# Patient Record
Sex: Female | Born: 1990 | Race: White | Hispanic: No | Smoking: Current every day smoker
Health system: Southern US, Community
[De-identification: ages and names within clinical notes are randomized; demographics above are authoritative.]

## PROBLEM LIST (undated history)

## (undated) DIAGNOSIS — R569 Unspecified convulsions: Secondary | ICD-10-CM

## (undated) DIAGNOSIS — F32A Depression, unspecified: Secondary | ICD-10-CM

## (undated) HISTORY — PX: BRAIN SURGERY: SHX531

---

## 2010-04-01 ENCOUNTER — Ambulatory Visit (HOSPITAL_COMMUNITY): Admission: RE | Admit: 2010-04-01 | Discharge: 2010-04-01 | Payer: Self-pay | Admitting: Neurology

## 2011-08-31 DIAGNOSIS — Z9889 Other specified postprocedural states: Secondary | ICD-10-CM | POA: Diagnosis not present

## 2011-08-31 DIAGNOSIS — O09899 Supervision of other high risk pregnancies, unspecified trimester: Secondary | ICD-10-CM | POA: Diagnosis not present

## 2011-08-31 DIAGNOSIS — G40209 Localization-related (focal) (partial) symptomatic epilepsy and epileptic syndromes with complex partial seizures, not intractable, without status epilepticus: Secondary | ICD-10-CM | POA: Diagnosis not present

## 2011-08-31 DIAGNOSIS — F909 Attention-deficit hyperactivity disorder, unspecified type: Secondary | ICD-10-CM | POA: Diagnosis not present

## 2011-10-06 DIAGNOSIS — Z3480 Encounter for supervision of other normal pregnancy, unspecified trimester: Secondary | ICD-10-CM | POA: Diagnosis not present

## 2011-10-06 DIAGNOSIS — Z36 Encounter for antenatal screening of mother: Secondary | ICD-10-CM | POA: Diagnosis not present

## 2011-10-19 DIAGNOSIS — Z79899 Other long term (current) drug therapy: Secondary | ICD-10-CM | POA: Diagnosis not present

## 2011-10-19 DIAGNOSIS — F079 Unspecified personality and behavioral disorder due to known physiological condition: Secondary | ICD-10-CM | POA: Diagnosis not present

## 2011-10-19 DIAGNOSIS — G40909 Epilepsy, unspecified, not intractable, without status epilepticus: Secondary | ICD-10-CM | POA: Diagnosis not present

## 2011-10-31 DIAGNOSIS — G40209 Localization-related (focal) (partial) symptomatic epilepsy and epileptic syndromes with complex partial seizures, not intractable, without status epilepticus: Secondary | ICD-10-CM | POA: Diagnosis not present

## 2011-12-02 DIAGNOSIS — Z1389 Encounter for screening for other disorder: Secondary | ICD-10-CM | POA: Diagnosis not present

## 2011-12-02 DIAGNOSIS — Z3480 Encounter for supervision of other normal pregnancy, unspecified trimester: Secondary | ICD-10-CM | POA: Diagnosis not present

## 2011-12-02 DIAGNOSIS — O321XX0 Maternal care for breech presentation, not applicable or unspecified: Secondary | ICD-10-CM | POA: Diagnosis not present

## 2012-01-09 DIAGNOSIS — Z36 Encounter for antenatal screening of mother: Secondary | ICD-10-CM | POA: Diagnosis not present

## 2012-01-09 DIAGNOSIS — O360990 Maternal care for other rhesus isoimmunization, unspecified trimester, not applicable or unspecified: Secondary | ICD-10-CM | POA: Diagnosis not present

## 2012-01-09 DIAGNOSIS — Z3480 Encounter for supervision of other normal pregnancy, unspecified trimester: Secondary | ICD-10-CM | POA: Diagnosis not present

## 2012-01-12 DIAGNOSIS — G40909 Epilepsy, unspecified, not intractable, without status epilepticus: Secondary | ICD-10-CM | POA: Diagnosis not present

## 2012-01-12 DIAGNOSIS — Z79899 Other long term (current) drug therapy: Secondary | ICD-10-CM | POA: Diagnosis not present

## 2012-01-20 DIAGNOSIS — G40909 Epilepsy, unspecified, not intractable, without status epilepticus: Secondary | ICD-10-CM | POA: Diagnosis not present

## 2012-01-20 DIAGNOSIS — G40209 Localization-related (focal) (partial) symptomatic epilepsy and epileptic syndromes with complex partial seizures, not intractable, without status epilepticus: Secondary | ICD-10-CM | POA: Diagnosis not present

## 2012-01-20 DIAGNOSIS — O09899 Supervision of other high risk pregnancies, unspecified trimester: Secondary | ICD-10-CM | POA: Diagnosis not present

## 2012-01-20 DIAGNOSIS — Z331 Pregnant state, incidental: Secondary | ICD-10-CM | POA: Diagnosis not present

## 2012-02-29 DIAGNOSIS — G40919 Epilepsy, unspecified, intractable, without status epilepticus: Secondary | ICD-10-CM | POA: Diagnosis not present

## 2012-02-29 DIAGNOSIS — O09899 Supervision of other high risk pregnancies, unspecified trimester: Secondary | ICD-10-CM | POA: Diagnosis not present

## 2012-03-09 DIAGNOSIS — R569 Unspecified convulsions: Secondary | ICD-10-CM | POA: Diagnosis not present

## 2012-03-09 DIAGNOSIS — Z3685 Encounter for antenatal screening for Streptococcus B: Secondary | ICD-10-CM | POA: Diagnosis not present

## 2012-03-09 DIAGNOSIS — G40909 Epilepsy, unspecified, not intractable, without status epilepticus: Secondary | ICD-10-CM | POA: Diagnosis not present

## 2012-03-09 DIAGNOSIS — O9989 Other specified diseases and conditions complicating pregnancy, childbirth and the puerperium: Secondary | ICD-10-CM | POA: Diagnosis not present

## 2012-03-09 DIAGNOSIS — Z3480 Encounter for supervision of other normal pregnancy, unspecified trimester: Secondary | ICD-10-CM | POA: Diagnosis not present

## 2012-04-10 DIAGNOSIS — D62 Acute posthemorrhagic anemia: Secondary | ICD-10-CM | POA: Diagnosis not present

## 2012-04-10 DIAGNOSIS — O99354 Diseases of the nervous system complicating childbirth: Secondary | ICD-10-CM | POA: Diagnosis not present

## 2012-04-10 DIAGNOSIS — Z79899 Other long term (current) drug therapy: Secondary | ICD-10-CM | POA: Diagnosis not present

## 2012-04-10 DIAGNOSIS — Z23 Encounter for immunization: Secondary | ICD-10-CM | POA: Diagnosis not present

## 2012-04-10 DIAGNOSIS — G40909 Epilepsy, unspecified, not intractable, without status epilepticus: Secondary | ICD-10-CM | POA: Diagnosis not present

## 2012-04-10 DIAGNOSIS — O43899 Other placental disorders, unspecified trimester: Secondary | ICD-10-CM | POA: Diagnosis present

## 2012-04-10 DIAGNOSIS — O99892 Other specified diseases and conditions complicating childbirth: Secondary | ICD-10-CM | POA: Diagnosis not present

## 2012-04-10 DIAGNOSIS — Z22330 Carrier of Group B streptococcus: Secondary | ICD-10-CM | POA: Diagnosis not present

## 2012-04-10 DIAGNOSIS — O9903 Anemia complicating the puerperium: Secondary | ICD-10-CM | POA: Diagnosis not present

## 2012-04-10 DIAGNOSIS — O34219 Maternal care for unspecified type scar from previous cesarean delivery: Secondary | ICD-10-CM | POA: Diagnosis not present

## 2012-04-10 DIAGNOSIS — O368990 Maternal care for other specified fetal problems, unspecified trimester, not applicable or unspecified: Secondary | ICD-10-CM | POA: Diagnosis not present

## 2012-04-18 DIAGNOSIS — Z79899 Other long term (current) drug therapy: Secondary | ICD-10-CM | POA: Diagnosis not present

## 2012-04-18 DIAGNOSIS — O900 Disruption of cesarean delivery wound: Secondary | ICD-10-CM | POA: Diagnosis not present

## 2012-04-18 DIAGNOSIS — T8131XA Disruption of external operation (surgical) wound, not elsewhere classified, initial encounter: Secondary | ICD-10-CM | POA: Diagnosis not present

## 2012-04-18 DIAGNOSIS — IMO0002 Reserved for concepts with insufficient information to code with codable children: Secondary | ICD-10-CM | POA: Diagnosis not present

## 2012-04-18 DIAGNOSIS — O909 Complication of the puerperium, unspecified: Secondary | ICD-10-CM | POA: Diagnosis not present

## 2012-04-18 DIAGNOSIS — G40909 Epilepsy, unspecified, not intractable, without status epilepticus: Secondary | ICD-10-CM | POA: Diagnosis not present

## 2012-04-20 DIAGNOSIS — T8130XA Disruption of wound, unspecified, initial encounter: Secondary | ICD-10-CM | POA: Diagnosis not present

## 2012-04-26 DIAGNOSIS — Z79899 Other long term (current) drug therapy: Secondary | ICD-10-CM | POA: Diagnosis not present

## 2012-04-26 DIAGNOSIS — G40909 Epilepsy, unspecified, not intractable, without status epilepticus: Secondary | ICD-10-CM | POA: Diagnosis not present

## 2012-07-04 DIAGNOSIS — Z3049 Encounter for surveillance of other contraceptives: Secondary | ICD-10-CM | POA: Diagnosis not present

## 2012-07-12 DIAGNOSIS — G40919 Epilepsy, unspecified, intractable, without status epilepticus: Secondary | ICD-10-CM | POA: Diagnosis not present

## 2012-07-12 DIAGNOSIS — Z9889 Other specified postprocedural states: Secondary | ICD-10-CM | POA: Diagnosis not present

## 2012-07-19 DIAGNOSIS — Z79899 Other long term (current) drug therapy: Secondary | ICD-10-CM | POA: Diagnosis not present

## 2012-07-19 DIAGNOSIS — G40909 Epilepsy, unspecified, not intractable, without status epilepticus: Secondary | ICD-10-CM | POA: Diagnosis not present

## 2012-08-01 DIAGNOSIS — Z3049 Encounter for surveillance of other contraceptives: Secondary | ICD-10-CM | POA: Diagnosis not present

## 2012-08-01 DIAGNOSIS — N926 Irregular menstruation, unspecified: Secondary | ICD-10-CM | POA: Diagnosis not present

## 2012-08-05 DIAGNOSIS — J40 Bronchitis, not specified as acute or chronic: Secondary | ICD-10-CM | POA: Diagnosis not present

## 2012-08-05 DIAGNOSIS — J209 Acute bronchitis, unspecified: Secondary | ICD-10-CM | POA: Diagnosis not present

## 2012-08-05 DIAGNOSIS — Z79899 Other long term (current) drug therapy: Secondary | ICD-10-CM | POA: Diagnosis not present

## 2012-08-23 DIAGNOSIS — R109 Unspecified abdominal pain: Secondary | ICD-10-CM | POA: Diagnosis not present

## 2012-08-23 DIAGNOSIS — Z79899 Other long term (current) drug therapy: Secondary | ICD-10-CM | POA: Diagnosis not present

## 2012-08-23 DIAGNOSIS — Z5320 Procedure and treatment not carried out because of patient's decision for unspecified reasons: Secondary | ICD-10-CM | POA: Diagnosis not present

## 2012-08-23 DIAGNOSIS — B9789 Other viral agents as the cause of diseases classified elsewhere: Secondary | ICD-10-CM | POA: Diagnosis not present

## 2012-08-23 DIAGNOSIS — R112 Nausea with vomiting, unspecified: Secondary | ICD-10-CM | POA: Diagnosis not present

## 2012-08-23 DIAGNOSIS — G40909 Epilepsy, unspecified, not intractable, without status epilepticus: Secondary | ICD-10-CM | POA: Diagnosis not present

## 2012-09-18 DIAGNOSIS — Z3202 Encounter for pregnancy test, result negative: Secondary | ICD-10-CM | POA: Diagnosis not present

## 2012-09-26 DIAGNOSIS — Z309 Encounter for contraceptive management, unspecified: Secondary | ICD-10-CM | POA: Diagnosis not present

## 2012-10-16 DIAGNOSIS — M25569 Pain in unspecified knee: Secondary | ICD-10-CM | POA: Diagnosis not present

## 2012-10-16 DIAGNOSIS — R51 Headache: Secondary | ICD-10-CM | POA: Diagnosis not present

## 2012-10-16 DIAGNOSIS — G40909 Epilepsy, unspecified, not intractable, without status epilepticus: Secondary | ICD-10-CM | POA: Diagnosis not present

## 2012-10-16 DIAGNOSIS — Z79899 Other long term (current) drug therapy: Secondary | ICD-10-CM | POA: Diagnosis not present

## 2012-11-08 DIAGNOSIS — R635 Abnormal weight gain: Secondary | ICD-10-CM | POA: Diagnosis not present

## 2012-11-15 DIAGNOSIS — R569 Unspecified convulsions: Secondary | ICD-10-CM | POA: Diagnosis not present

## 2012-11-15 DIAGNOSIS — E669 Obesity, unspecified: Secondary | ICD-10-CM | POA: Diagnosis not present

## 2012-11-15 DIAGNOSIS — N912 Amenorrhea, unspecified: Secondary | ICD-10-CM | POA: Diagnosis not present

## 2012-11-15 DIAGNOSIS — Z13220 Encounter for screening for lipoid disorders: Secondary | ICD-10-CM | POA: Diagnosis not present

## 2012-11-27 DIAGNOSIS — K5900 Constipation, unspecified: Secondary | ICD-10-CM | POA: Diagnosis not present

## 2012-11-27 DIAGNOSIS — K644 Residual hemorrhoidal skin tags: Secondary | ICD-10-CM | POA: Diagnosis not present

## 2012-12-10 DIAGNOSIS — R635 Abnormal weight gain: Secondary | ICD-10-CM | POA: Diagnosis not present

## 2012-12-18 DIAGNOSIS — M25569 Pain in unspecified knee: Secondary | ICD-10-CM | POA: Diagnosis not present

## 2012-12-18 DIAGNOSIS — Z79899 Other long term (current) drug therapy: Secondary | ICD-10-CM | POA: Diagnosis not present

## 2012-12-18 DIAGNOSIS — R51 Headache: Secondary | ICD-10-CM | POA: Diagnosis not present

## 2012-12-18 DIAGNOSIS — G40919 Epilepsy, unspecified, intractable, without status epilepticus: Secondary | ICD-10-CM | POA: Diagnosis not present

## 2013-01-09 DIAGNOSIS — R635 Abnormal weight gain: Secondary | ICD-10-CM | POA: Diagnosis not present

## 2013-01-31 DIAGNOSIS — N938 Other specified abnormal uterine and vaginal bleeding: Secondary | ICD-10-CM | POA: Diagnosis not present

## 2013-02-25 DIAGNOSIS — R635 Abnormal weight gain: Secondary | ICD-10-CM | POA: Diagnosis not present

## 2013-02-25 DIAGNOSIS — N920 Excessive and frequent menstruation with regular cycle: Secondary | ICD-10-CM | POA: Diagnosis not present

## 2013-03-20 DIAGNOSIS — Z3202 Encounter for pregnancy test, result negative: Secondary | ICD-10-CM | POA: Diagnosis not present

## 2013-04-19 DIAGNOSIS — Z79899 Other long term (current) drug therapy: Secondary | ICD-10-CM | POA: Diagnosis not present

## 2013-04-19 DIAGNOSIS — M79609 Pain in unspecified limb: Secondary | ICD-10-CM | POA: Diagnosis not present

## 2013-04-19 DIAGNOSIS — R51 Headache: Secondary | ICD-10-CM | POA: Diagnosis not present

## 2013-04-19 DIAGNOSIS — G40919 Epilepsy, unspecified, intractable, without status epilepticus: Secondary | ICD-10-CM | POA: Diagnosis not present

## 2013-05-02 DIAGNOSIS — R569 Unspecified convulsions: Secondary | ICD-10-CM | POA: Diagnosis not present

## 2013-05-28 DIAGNOSIS — Z3201 Encounter for pregnancy test, result positive: Secondary | ICD-10-CM | POA: Diagnosis not present

## 2013-05-28 DIAGNOSIS — Z36 Encounter for antenatal screening of mother: Secondary | ICD-10-CM | POA: Diagnosis not present

## 2013-05-28 DIAGNOSIS — Z3480 Encounter for supervision of other normal pregnancy, unspecified trimester: Secondary | ICD-10-CM | POA: Diagnosis not present

## 2013-05-28 DIAGNOSIS — Z1159 Encounter for screening for other viral diseases: Secondary | ICD-10-CM | POA: Diagnosis not present

## 2013-06-07 DIAGNOSIS — O209 Hemorrhage in early pregnancy, unspecified: Secondary | ICD-10-CM | POA: Diagnosis not present

## 2013-06-07 DIAGNOSIS — O2390 Unspecified genitourinary tract infection in pregnancy, unspecified trimester: Secondary | ICD-10-CM | POA: Diagnosis not present

## 2013-06-07 DIAGNOSIS — N390 Urinary tract infection, site not specified: Secondary | ICD-10-CM | POA: Diagnosis not present

## 2013-06-12 DIAGNOSIS — O209 Hemorrhage in early pregnancy, unspecified: Secondary | ICD-10-CM | POA: Diagnosis not present

## 2013-07-01 ENCOUNTER — Encounter (HOSPITAL_COMMUNITY): Payer: Self-pay | Admitting: Emergency Medicine

## 2013-07-01 ENCOUNTER — Emergency Department (HOSPITAL_COMMUNITY)
Admission: EM | Admit: 2013-07-01 | Discharge: 2013-07-01 | Disposition: A | Discharge status: Home / Self Care | Payer: Medicare Other | Attending: Emergency Medicine | Admitting: Emergency Medicine

## 2013-07-01 ENCOUNTER — Emergency Department (HOSPITAL_COMMUNITY): Payer: Medicare Other

## 2013-07-01 DIAGNOSIS — O200 Threatened abortion: Secondary | ICD-10-CM

## 2013-07-01 DIAGNOSIS — O368990 Maternal care for other specified fetal problems, unspecified trimester, not applicable or unspecified: Secondary | ICD-10-CM | POA: Diagnosis not present

## 2013-07-01 HISTORY — DX: Unspecified convulsions: R56.9

## 2013-07-01 LAB — CBC WITH DIFFERENTIAL/PLATELET
Basophils Absolute: 0 10*3/uL (ref 0.0–0.1)
Eosinophils Absolute: 0.1 10*3/uL (ref 0.0–0.7)
Eosinophils Relative: 1 % (ref 0–5)
HCT: 39.5 % (ref 36.0–46.0)
Hemoglobin: 13.5 g/dL (ref 12.0–15.0)
MCHC: 34.2 g/dL (ref 30.0–36.0)
MCV: 87.4 fL (ref 78.0–100.0)
Monocytes Absolute: 0.7 10*3/uL (ref 0.1–1.0)
Monocytes Relative: 7 % (ref 3–12)
Neutro Abs: 7.1 10*3/uL (ref 1.7–7.7)
WBC: 11 10*3/uL — ABNORMAL HIGH (ref 4.0–10.5)

## 2013-07-01 LAB — COMPREHENSIVE METABOLIC PANEL
Albumin: 3.9 g/dL (ref 3.5–5.2)
BUN: 8 mg/dL (ref 6–23)
Calcium: 9.9 mg/dL (ref 8.4–10.5)
Chloride: 99 mEq/L (ref 96–112)
GFR calc Af Amer: >90 mL/min (ref >90)
Total Bilirubin: 0.3 mg/dL (ref 0.3–1.2)
Total Protein: 7.9 g/dL (ref 6.0–8.3)

## 2013-07-01 LAB — ABO/RH: ABO/RH(D): A NEG

## 2013-07-01 LAB — HCG, QUANTITATIVE, PREGNANCY: hCG, Beta Chain, Quant, S: 31213 m[IU]/mL — ABNORMAL HIGH (ref <5)

## 2013-07-01 MED ORDER — RHO D IMMUNE GLOBULIN 1500 UNIT/2ML IJ SOLN
300.0000 ug | Freq: Once | INTRAMUSCULAR | Status: AC
  Administered 2013-07-01: 300 ug via INTRAMUSCULAR

## 2013-07-01 NOTE — ED Notes (Signed)
Pt reports heavy bleeding starting today that subsided into spotting at this time. Pt reports 5 pregnancies with 2 miscarriages. Pt also reports having episodes of bleeding with her live births "and I got that shot because of the blood differences between the baby and me".

## 2013-07-01 NOTE — ED Notes (Signed)
Pt states is [redacted] weeks pregnant. Has been seeing ob. Starting "pouring blood" about 2 hrs ago. Denies pain. Dark red.

## 2013-07-01 NOTE — ED Provider Notes (Signed)
CSN: [id number]     Arrival date & time 07/01/13  1638 History  This chart was scribed for Benny Lennert, MD by Leone Payor, ED Scribe. This patient was seen in room APA18/APA18 and the patient's care was started 7:48 PM.    Chief Complaint  Patient presents with  . Vaginal Bleeding    Patient is a 22 y.o. female presenting with vaginal bleeding. The history is provided by the patient. No language interpreter was used.  Vaginal Bleeding Quality:  Dark red Severity:  Mild Onset quality:  Sudden Duration:  2 hours Timing:  Unable to specify Progression:  Unchanged Chronicity:  New Possible pregnancy: yes   Associated symptoms: no abdominal pain, no back pain and no fatigue     HPI Comments: Lisa Boone is a 22 y.o. female who presents to the Emergency Department complaining of vaginal bleeding that began about 2 hours ago. Pt states she is [redacted] weeks pregnant with her 3rd pregnancy. Pt states she is Rh- and has needed RhoGAM injections for both of her previous pregnancies. Pt is concerned this is related to that and wants to be evaluated. Pt states she will see her OB on 07/10/13 for a scheduled appointment.   [facility] [place]  Past Medical History  Diagnosis Date  . Seizures    Past Surgical History  Procedure Laterality Date  . Brain surgery    . Cesarean section     History reviewed. No pertinent family history. History  Substance Use Topics  . Smoking status: Never Smoker   . Smokeless tobacco: Not on file  . Alcohol Use: No   OB History   Grav Para Term Preterm Abortions TAB SAB Ect Mult Living   5 2             Review of Systems  Constitutional: Negative for appetite change and fatigue.  HENT: Negative for congestion, ear discharge and sinus pressure.   Eyes: Negative for discharge.  Respiratory: Negative for cough.   Cardiovascular: Negative for chest pain.  Gastrointestinal: Negative for abdominal pain and diarrhea.  Genitourinary: Positive for  vaginal bleeding. Negative for frequency and hematuria.  Musculoskeletal: Negative for back pain.  Skin: Negative for rash.  Neurological: Negative for seizures and headaches.  Psychiatric/Behavioral: Negative for hallucinations.    Allergies  Review of patient's allergies indicates no known allergies.  Home Medications  No current outpatient prescriptions on file. BP 123/68  Pulse 74  Temp(Src) 98 F (36.7 C) (Oral)  Resp 20  Ht 5\' 7"  (1.702 m)  Wt 215 lb (97.523 kg)  BMI 33.67 kg/m2  SpO2 100%  LMP 04/22/2013 Physical Exam  Constitutional: She is oriented to person, place, and time. She appears well-developed.  HENT:  Head: Normocephalic.  Eyes: Conjunctivae and EOM are normal. No scleral icterus.  Neck: Neck supple. No thyromegaly present.  Cardiovascular: Normal rate and regular rhythm.  Exam reveals no gallop and no friction rub.   No murmur heard. Pulmonary/Chest: No stridor. She has no wheezes. She has no rales. She exhibits no tenderness.  Abdominal: She exhibits no distension. There is no tenderness. There is no rebound.  Musculoskeletal: Normal range of motion. She exhibits no edema.  Lymphadenopathy:    She has no cervical adenopathy.  Neurological: She is oriented to person, place, and time. She exhibits normal muscle tone. Coordination normal.  Skin: No rash noted. No erythema.  Psychiatric: She has a normal mood and affect. Her behavior is normal.  ED Course  Procedures   DIAGNOSTIC STUDIES: Oxygen Saturation is 100% on RA, normal by my interpretation.    COORDINATION OF CARE: 7:58 PM Discussed treatment plan with pt at bedside and pt agreed to plan.   Labs Review Labs Reviewed  CBC WITH DIFFERENTIAL - Abnormal; Notable for the following:    WBC 11.0 (*)    All other components within normal limits  HCG, QUANTITATIVE, PREGNANCY - Abnormal; Notable for the following:    hCG, Beta Chain, Quant, S [address]109 (*)    All other components within normal  limits  COMPREHENSIVE METABOLIC PANEL  ABO/RH   Imaging Review US Ob Comp Less 14 Wks  07/01/2013   CLINICAL DATA:  22 year old female with bleeding. Estimated gestational age by LMP 10 weeks and 4 days.  EXAM: OBSTETRIC <14 WK [place] AND TRANSVAGINAL OB US  TECHNIQUE: Both transabdominal and transvaginal ultrasound examinations were performed for complete evaluation of the gestation as well as the maternal uterus, adnexal regions, and pelvic cul-de-sac. Transvaginal technique was performed to assess early pregnancy.  COMPARISON:  None relevant.  FINDINGS: Intrauterine gestational sac: Single  Yolk sac:  Visible  Embryo:  Visible  Cardiac Activity: Detected  Heart Rate:  175 bpm  CRL:   40.5  mm   11 w 0 d                  [place] EDC: 01/20/2014  Maternal uterus/adnexae: Small volume subchorionic hemorrhage (image 26). No pelvic free fluid.  No ovarian abnormality.  IMPRESSION: Viable singleton intrauterine pregnancy with estimated gestational age of [redacted] weeks and 0 days by crown-rump length. Small volume subchorionic hemorrhage. No pelvic free fluid.   Electronically Signed   By: Augusto Gamble M.D.   On: 07/01/2013 18:39   US Ob Transvaginal  07/01/2013   CLINICAL DATA:  22 year old female with bleeding. Estimated gestational age by LMP 10 weeks and 4 days.  EXAM: OBSTETRIC <14 WK [place] AND TRANSVAGINAL OB US  TECHNIQUE: Both transabdominal and transvaginal ultrasound examinations were performed for complete evaluation of the gestation as well as the maternal uterus, adnexal regions, and pelvic cul-de-sac. Transvaginal technique was performed to assess early pregnancy.  COMPARISON:  None relevant.  FINDINGS: Intrauterine gestational sac: Single  Yolk sac:  Visible  Embryo:  Visible  Cardiac Activity: Detected  Heart Rate:  175 bpm  CRL:   40.5  mm   11 w 0 d                  [place] EDC: 01/20/2014  Maternal uterus/adnexae: Small volume subchorionic hemorrhage (image 26). No pelvic free fluid.  No ovarian abnormality.   IMPRESSION: Viable singleton intrauterine pregnancy with estimated gestational age of [redacted] weeks and 0 days by crown-rump length. Small volume subchorionic hemorrhage. No pelvic free fluid.   Electronically Signed   By: Augusto Gamble M.D.   On: 07/01/2013 18:39    EKG Interpretation   None       MDM  No diagnosis found.   The chart was scribed for me under my direct supervision.  I personally performed the history, physical, and medical decision making and all procedures in the evaluation of this patient.Benny Lennert, MD 07/02/13 [phone]

## 2013-07-01 NOTE — ED Notes (Signed)
Lab reports they are working on the Rhogam order.

## 2013-07-02 LAB — RH IG WORKUP (INCLUDES ABO/RH)
ABO/RH(D): A NEG
Antibody Screen: NEGATIVE

## 2013-07-10 DIAGNOSIS — Z36 Encounter for antenatal screening of mother: Secondary | ICD-10-CM | POA: Diagnosis not present

## 2013-07-10 DIAGNOSIS — Z3480 Encounter for supervision of other normal pregnancy, unspecified trimester: Secondary | ICD-10-CM | POA: Diagnosis not present

## 2013-07-10 DIAGNOSIS — O9989 Other specified diseases and conditions complicating pregnancy, childbirth and the puerperium: Secondary | ICD-10-CM | POA: Diagnosis not present

## 2013-07-10 DIAGNOSIS — R300 Dysuria: Secondary | ICD-10-CM | POA: Diagnosis not present

## 2013-07-13 DIAGNOSIS — N760 Acute vaginitis: Secondary | ICD-10-CM | POA: Diagnosis not present

## 2013-07-13 DIAGNOSIS — O200 Threatened abortion: Secondary | ICD-10-CM | POA: Diagnosis not present

## 2013-07-13 DIAGNOSIS — N390 Urinary tract infection, site not specified: Secondary | ICD-10-CM | POA: Diagnosis not present

## 2013-07-13 DIAGNOSIS — B9689 Other specified bacterial agents as the cause of diseases classified elsewhere: Secondary | ICD-10-CM | POA: Diagnosis not present

## 2013-07-13 DIAGNOSIS — A499 Bacterial infection, unspecified: Secondary | ICD-10-CM | POA: Diagnosis not present

## 2013-07-13 DIAGNOSIS — O2390 Unspecified genitourinary tract infection in pregnancy, unspecified trimester: Secondary | ICD-10-CM | POA: Diagnosis not present

## 2013-07-23 DIAGNOSIS — G40909 Epilepsy, unspecified, not intractable, without status epilepticus: Secondary | ICD-10-CM | POA: Diagnosis not present

## 2013-07-23 DIAGNOSIS — G40919 Epilepsy, unspecified, intractable, without status epilepticus: Secondary | ICD-10-CM | POA: Diagnosis not present

## 2013-07-23 DIAGNOSIS — Z79899 Other long term (current) drug therapy: Secondary | ICD-10-CM | POA: Diagnosis not present

## 2013-08-07 DIAGNOSIS — Z361 Encounter for antenatal screening for raised alphafetoprotein level: Secondary | ICD-10-CM | POA: Diagnosis not present

## 2013-08-07 DIAGNOSIS — Z3480 Encounter for supervision of other normal pregnancy, unspecified trimester: Secondary | ICD-10-CM | POA: Diagnosis not present

## 2013-08-07 DIAGNOSIS — Z23 Encounter for immunization: Secondary | ICD-10-CM | POA: Diagnosis not present

## 2013-09-04 DIAGNOSIS — O321XX0 Maternal care for breech presentation, not applicable or unspecified: Secondary | ICD-10-CM | POA: Diagnosis not present

## 2013-09-04 DIAGNOSIS — Z1389 Encounter for screening for other disorder: Secondary | ICD-10-CM | POA: Diagnosis not present

## 2013-09-04 DIAGNOSIS — Z3480 Encounter for supervision of other normal pregnancy, unspecified trimester: Secondary | ICD-10-CM | POA: Diagnosis not present

## 2013-09-08 DIAGNOSIS — R109 Unspecified abdominal pain: Secondary | ICD-10-CM | POA: Diagnosis not present

## 2013-09-08 DIAGNOSIS — O99891 Other specified diseases and conditions complicating pregnancy: Secondary | ICD-10-CM | POA: Diagnosis not present

## 2013-09-16 DIAGNOSIS — O99891 Other specified diseases and conditions complicating pregnancy: Secondary | ICD-10-CM | POA: Diagnosis not present

## 2013-09-16 DIAGNOSIS — R109 Unspecified abdominal pain: Secondary | ICD-10-CM | POA: Diagnosis not present

## 2013-09-16 DIAGNOSIS — R110 Nausea: Secondary | ICD-10-CM | POA: Diagnosis not present

## 2013-09-19 DIAGNOSIS — G40919 Epilepsy, unspecified, intractable, without status epilepticus: Secondary | ICD-10-CM | POA: Diagnosis not present

## 2013-09-19 DIAGNOSIS — G40909 Epilepsy, unspecified, not intractable, without status epilepticus: Secondary | ICD-10-CM | POA: Diagnosis not present

## 2013-09-19 DIAGNOSIS — Z79899 Other long term (current) drug therapy: Secondary | ICD-10-CM | POA: Diagnosis not present

## 2013-09-25 DIAGNOSIS — Z5181 Encounter for therapeutic drug level monitoring: Secondary | ICD-10-CM | POA: Diagnosis not present

## 2013-09-25 DIAGNOSIS — Z3480 Encounter for supervision of other normal pregnancy, unspecified trimester: Secondary | ICD-10-CM | POA: Diagnosis not present

## 2013-10-02 DIAGNOSIS — O365990 Maternal care for other known or suspected poor fetal growth, unspecified trimester, not applicable or unspecified: Secondary | ICD-10-CM | POA: Diagnosis not present

## 2013-10-02 DIAGNOSIS — O321XX0 Maternal care for breech presentation, not applicable or unspecified: Secondary | ICD-10-CM | POA: Diagnosis not present

## 2013-10-03 DIAGNOSIS — O99350 Diseases of the nervous system complicating pregnancy, unspecified trimester: Secondary | ICD-10-CM | POA: Diagnosis not present

## 2013-10-03 DIAGNOSIS — G40919 Epilepsy, unspecified, intractable, without status epilepticus: Secondary | ICD-10-CM | POA: Diagnosis not present

## 2013-10-03 DIAGNOSIS — G40909 Epilepsy, unspecified, not intractable, without status epilepticus: Secondary | ICD-10-CM | POA: Diagnosis not present

## 2013-10-03 DIAGNOSIS — Z79899 Other long term (current) drug therapy: Secondary | ICD-10-CM | POA: Diagnosis not present

## 2013-10-16 DIAGNOSIS — D649 Anemia, unspecified: Secondary | ICD-10-CM | POA: Diagnosis not present

## 2013-11-01 DIAGNOSIS — O4690 Antepartum hemorrhage, unspecified, unspecified trimester: Secondary | ICD-10-CM | POA: Diagnosis not present

## 2013-11-06 DIAGNOSIS — G40909 Epilepsy, unspecified, not intractable, without status epilepticus: Secondary | ICD-10-CM | POA: Diagnosis not present

## 2013-11-19 DIAGNOSIS — Z79899 Other long term (current) drug therapy: Secondary | ICD-10-CM | POA: Diagnosis not present

## 2013-11-19 DIAGNOSIS — O99350 Diseases of the nervous system complicating pregnancy, unspecified trimester: Secondary | ICD-10-CM | POA: Diagnosis not present

## 2013-11-19 DIAGNOSIS — G40909 Epilepsy, unspecified, not intractable, without status epilepticus: Secondary | ICD-10-CM | POA: Diagnosis not present

## 2013-11-19 DIAGNOSIS — G40919 Epilepsy, unspecified, intractable, without status epilepticus: Secondary | ICD-10-CM | POA: Diagnosis not present

## 2013-12-09 DIAGNOSIS — Z331 Pregnant state, incidental: Secondary | ICD-10-CM | POA: Diagnosis not present

## 2013-12-09 DIAGNOSIS — R569 Unspecified convulsions: Secondary | ICD-10-CM | POA: Diagnosis not present

## 2013-12-09 DIAGNOSIS — Z0000 Encounter for general adult medical examination without abnormal findings: Secondary | ICD-10-CM | POA: Diagnosis not present

## 2013-12-17 DIAGNOSIS — Z3685 Encounter for antenatal screening for Streptococcus B: Secondary | ICD-10-CM | POA: Diagnosis not present

## 2013-12-24 DIAGNOSIS — O2690 Pregnancy related conditions, unspecified, unspecified trimester: Secondary | ICD-10-CM | POA: Diagnosis not present

## 2014-01-11 DIAGNOSIS — O368190 Decreased fetal movements, unspecified trimester, not applicable or unspecified: Secondary | ICD-10-CM | POA: Diagnosis not present

## 2014-01-14 DIAGNOSIS — F411 Generalized anxiety disorder: Secondary | ICD-10-CM | POA: Diagnosis not present

## 2014-01-14 DIAGNOSIS — G40919 Epilepsy, unspecified, intractable, without status epilepticus: Secondary | ICD-10-CM | POA: Diagnosis not present

## 2014-01-14 DIAGNOSIS — O99350 Diseases of the nervous system complicating pregnancy, unspecified trimester: Secondary | ICD-10-CM | POA: Diagnosis not present

## 2014-01-14 DIAGNOSIS — G40909 Epilepsy, unspecified, not intractable, without status epilepticus: Secondary | ICD-10-CM | POA: Diagnosis not present

## 2014-01-14 DIAGNOSIS — Z79899 Other long term (current) drug therapy: Secondary | ICD-10-CM | POA: Diagnosis not present

## 2014-01-16 DIAGNOSIS — O9902 Anemia complicating childbirth: Secondary | ICD-10-CM | POA: Diagnosis not present

## 2014-01-16 DIAGNOSIS — O99354 Diseases of the nervous system complicating childbirth: Secondary | ICD-10-CM | POA: Diagnosis not present

## 2014-01-16 DIAGNOSIS — G40909 Epilepsy, unspecified, not intractable, without status epilepticus: Secondary | ICD-10-CM | POA: Diagnosis not present

## 2014-01-16 DIAGNOSIS — Z298 Encounter for other specified prophylactic measures: Secondary | ICD-10-CM | POA: Diagnosis not present

## 2014-01-16 DIAGNOSIS — Z23 Encounter for immunization: Secondary | ICD-10-CM | POA: Diagnosis not present

## 2014-01-16 DIAGNOSIS — O34219 Maternal care for unspecified type scar from previous cesarean delivery: Secondary | ICD-10-CM | POA: Diagnosis not present

## 2014-01-16 DIAGNOSIS — Z79899 Other long term (current) drug therapy: Secondary | ICD-10-CM | POA: Diagnosis not present

## 2014-01-16 DIAGNOSIS — D649 Anemia, unspecified: Secondary | ICD-10-CM | POA: Diagnosis present

## 2014-01-16 DIAGNOSIS — Z302 Encounter for sterilization: Secondary | ICD-10-CM | POA: Diagnosis not present

## 2014-01-16 DIAGNOSIS — Z049 Encounter for examination and observation for unspecified reason: Secondary | ICD-10-CM | POA: Diagnosis not present

## 2014-01-17 DIAGNOSIS — Z049 Encounter for examination and observation for unspecified reason: Secondary | ICD-10-CM | POA: Diagnosis not present

## 2014-03-03 DIAGNOSIS — O99893 Other specified diseases and conditions complicating puerperium: Secondary | ICD-10-CM | POA: Diagnosis not present

## 2014-03-03 DIAGNOSIS — R87612 Low grade squamous intraepithelial lesion on cytologic smear of cervix (LGSIL): Secondary | ICD-10-CM | POA: Diagnosis not present

## 2014-03-03 DIAGNOSIS — B977 Papillomavirus as the cause of diseases classified elsewhere: Secondary | ICD-10-CM | POA: Diagnosis not present

## 2014-03-03 DIAGNOSIS — O9853 Other viral diseases complicating the puerperium: Secondary | ICD-10-CM | POA: Diagnosis not present

## 2014-03-13 DIAGNOSIS — G4700 Insomnia, unspecified: Secondary | ICD-10-CM | POA: Diagnosis not present

## 2014-03-13 DIAGNOSIS — Z79899 Other long term (current) drug therapy: Secondary | ICD-10-CM | POA: Diagnosis not present

## 2014-03-13 DIAGNOSIS — G40919 Epilepsy, unspecified, intractable, without status epilepticus: Secondary | ICD-10-CM | POA: Diagnosis not present

## 2014-03-13 DIAGNOSIS — F411 Generalized anxiety disorder: Secondary | ICD-10-CM | POA: Diagnosis not present

## 2014-04-08 DIAGNOSIS — R87619 Unspecified abnormal cytological findings in specimens from cervix uteri: Secondary | ICD-10-CM | POA: Diagnosis not present

## 2014-04-08 DIAGNOSIS — N870 Mild cervical dysplasia: Secondary | ICD-10-CM | POA: Diagnosis not present

## 2014-04-08 DIAGNOSIS — B977 Papillomavirus as the cause of diseases classified elsewhere: Secondary | ICD-10-CM | POA: Diagnosis not present

## 2014-04-08 DIAGNOSIS — N879 Dysplasia of cervix uteri, unspecified: Secondary | ICD-10-CM | POA: Diagnosis not present

## 2014-04-29 DIAGNOSIS — N870 Mild cervical dysplasia: Secondary | ICD-10-CM | POA: Diagnosis not present

## 2014-04-29 DIAGNOSIS — N920 Excessive and frequent menstruation with regular cycle: Secondary | ICD-10-CM | POA: Diagnosis not present

## 2014-05-07 DIAGNOSIS — N879 Dysplasia of cervix uteri, unspecified: Secondary | ICD-10-CM | POA: Diagnosis not present

## 2014-06-23 DIAGNOSIS — Z79899 Other long term (current) drug therapy: Secondary | ICD-10-CM | POA: Diagnosis not present

## 2014-06-23 DIAGNOSIS — G4701 Insomnia due to medical condition: Secondary | ICD-10-CM | POA: Diagnosis not present

## 2014-06-23 DIAGNOSIS — G40919 Epilepsy, unspecified, intractable, without status epilepticus: Secondary | ICD-10-CM | POA: Diagnosis not present

## 2014-06-23 DIAGNOSIS — F419 Anxiety disorder, unspecified: Secondary | ICD-10-CM | POA: Diagnosis not present

## 2014-06-25 DIAGNOSIS — N920 Excessive and frequent menstruation with regular cycle: Secondary | ICD-10-CM | POA: Diagnosis not present

## 2014-06-30 ENCOUNTER — Encounter (HOSPITAL_COMMUNITY): Payer: Self-pay | Admitting: Emergency Medicine

## 2014-07-15 DIAGNOSIS — N938 Other specified abnormal uterine and vaginal bleeding: Secondary | ICD-10-CM | POA: Diagnosis not present

## 2014-07-15 DIAGNOSIS — R102 Pelvic and perineal pain: Secondary | ICD-10-CM | POA: Diagnosis not present

## 2014-07-15 DIAGNOSIS — N830 Follicular cyst of ovary: Secondary | ICD-10-CM | POA: Diagnosis not present

## 2014-07-15 DIAGNOSIS — N920 Excessive and frequent menstruation with regular cycle: Secondary | ICD-10-CM | POA: Diagnosis not present

## 2014-08-26 DIAGNOSIS — N8320 Unspecified ovarian cysts: Secondary | ICD-10-CM | POA: Diagnosis present

## 2014-08-26 DIAGNOSIS — N920 Excessive and frequent menstruation with regular cycle: Secondary | ICD-10-CM | POA: Diagnosis not present

## 2014-08-26 DIAGNOSIS — Z79899 Other long term (current) drug therapy: Secondary | ICD-10-CM | POA: Diagnosis not present

## 2014-08-26 DIAGNOSIS — N925 Other specified irregular menstruation: Secondary | ICD-10-CM | POA: Diagnosis not present

## 2014-08-26 DIAGNOSIS — B977 Papillomavirus as the cause of diseases classified elsewhere: Secondary | ICD-10-CM | POA: Diagnosis not present

## 2014-08-26 DIAGNOSIS — D62 Acute posthemorrhagic anemia: Secondary | ICD-10-CM | POA: Diagnosis not present

## 2014-08-26 DIAGNOSIS — N879 Dysplasia of cervix uteri, unspecified: Secondary | ICD-10-CM | POA: Diagnosis not present

## 2014-08-26 DIAGNOSIS — R102 Pelvic and perineal pain: Secondary | ICD-10-CM | POA: Diagnosis not present

## 2014-08-29 DIAGNOSIS — B977 Papillomavirus as the cause of diseases classified elsewhere: Secondary | ICD-10-CM | POA: Diagnosis not present

## 2014-08-29 DIAGNOSIS — N879 Dysplasia of cervix uteri, unspecified: Secondary | ICD-10-CM | POA: Diagnosis not present

## 2014-09-12 DIAGNOSIS — R300 Dysuria: Secondary | ICD-10-CM | POA: Diagnosis not present

## 2014-10-01 DIAGNOSIS — R309 Painful micturition, unspecified: Secondary | ICD-10-CM | POA: Diagnosis not present

## 2014-10-06 DIAGNOSIS — F419 Anxiety disorder, unspecified: Secondary | ICD-10-CM | POA: Diagnosis not present

## 2014-10-06 DIAGNOSIS — G40919 Epilepsy, unspecified, intractable, without status epilepticus: Secondary | ICD-10-CM | POA: Diagnosis not present

## 2014-10-06 DIAGNOSIS — Z79899 Other long term (current) drug therapy: Secondary | ICD-10-CM | POA: Diagnosis not present

## 2014-10-06 DIAGNOSIS — G4701 Insomnia due to medical condition: Secondary | ICD-10-CM | POA: Diagnosis not present

## 2014-10-15 DIAGNOSIS — N390 Urinary tract infection, site not specified: Secondary | ICD-10-CM | POA: Diagnosis not present

## 2015-01-21 DIAGNOSIS — Z114 Encounter for screening for human immunodeficiency virus [HIV]: Secondary | ICD-10-CM | POA: Diagnosis not present

## 2015-01-21 DIAGNOSIS — Z113 Encounter for screening for infections with a predominantly sexual mode of transmission: Secondary | ICD-10-CM | POA: Diagnosis not present

## 2015-01-30 DIAGNOSIS — A5400 Gonococcal infection of lower genitourinary tract, unspecified: Secondary | ICD-10-CM | POA: Diagnosis not present

## 2015-02-23 DIAGNOSIS — G4701 Insomnia due to medical condition: Secondary | ICD-10-CM | POA: Diagnosis not present

## 2015-02-23 DIAGNOSIS — G40919 Epilepsy, unspecified, intractable, without status epilepticus: Secondary | ICD-10-CM | POA: Diagnosis not present

## 2015-02-23 DIAGNOSIS — Z79899 Other long term (current) drug therapy: Secondary | ICD-10-CM | POA: Diagnosis not present

## 2015-02-23 DIAGNOSIS — Z113 Encounter for screening for infections with a predominantly sexual mode of transmission: Secondary | ICD-10-CM | POA: Diagnosis not present

## 2015-02-25 DIAGNOSIS — J029 Acute pharyngitis, unspecified: Secondary | ICD-10-CM | POA: Diagnosis not present

## 2015-02-25 DIAGNOSIS — R109 Unspecified abdominal pain: Secondary | ICD-10-CM | POA: Diagnosis not present

## 2015-02-25 DIAGNOSIS — R590 Localized enlarged lymph nodes: Secondary | ICD-10-CM | POA: Diagnosis not present

## 2015-04-01 DIAGNOSIS — M545 Low back pain: Secondary | ICD-10-CM | POA: Diagnosis not present

## 2015-04-30 DIAGNOSIS — M545 Low back pain: Secondary | ICD-10-CM | POA: Diagnosis not present

## 2015-05-11 DIAGNOSIS — M546 Pain in thoracic spine: Secondary | ICD-10-CM | POA: Diagnosis not present

## 2015-05-11 DIAGNOSIS — M6281 Muscle weakness (generalized): Secondary | ICD-10-CM | POA: Diagnosis not present

## 2015-05-11 DIAGNOSIS — M545 Low back pain: Secondary | ICD-10-CM | POA: Diagnosis not present

## 2015-05-15 DIAGNOSIS — M6281 Muscle weakness (generalized): Secondary | ICD-10-CM | POA: Diagnosis not present

## 2015-05-15 DIAGNOSIS — M546 Pain in thoracic spine: Secondary | ICD-10-CM | POA: Diagnosis not present

## 2015-05-15 DIAGNOSIS — M545 Low back pain: Secondary | ICD-10-CM | POA: Diagnosis not present

## 2015-05-19 DIAGNOSIS — M546 Pain in thoracic spine: Secondary | ICD-10-CM | POA: Diagnosis not present

## 2015-05-19 DIAGNOSIS — M6281 Muscle weakness (generalized): Secondary | ICD-10-CM | POA: Diagnosis not present

## 2015-05-19 DIAGNOSIS — M545 Low back pain: Secondary | ICD-10-CM | POA: Diagnosis not present

## 2015-05-26 DIAGNOSIS — M6281 Muscle weakness (generalized): Secondary | ICD-10-CM | POA: Diagnosis not present

## 2015-05-26 DIAGNOSIS — M545 Low back pain: Secondary | ICD-10-CM | POA: Diagnosis not present

## 2015-05-26 DIAGNOSIS — M546 Pain in thoracic spine: Secondary | ICD-10-CM | POA: Diagnosis not present

## 2015-05-28 DIAGNOSIS — M545 Low back pain: Secondary | ICD-10-CM | POA: Diagnosis not present

## 2015-05-28 DIAGNOSIS — M6281 Muscle weakness (generalized): Secondary | ICD-10-CM | POA: Diagnosis not present

## 2015-05-28 DIAGNOSIS — M546 Pain in thoracic spine: Secondary | ICD-10-CM | POA: Diagnosis not present

## 2015-06-02 DIAGNOSIS — M6281 Muscle weakness (generalized): Secondary | ICD-10-CM | POA: Diagnosis not present

## 2015-06-02 DIAGNOSIS — M545 Low back pain: Secondary | ICD-10-CM | POA: Diagnosis not present

## 2015-06-02 DIAGNOSIS — M546 Pain in thoracic spine: Secondary | ICD-10-CM | POA: Diagnosis not present

## 2015-06-04 DIAGNOSIS — M546 Pain in thoracic spine: Secondary | ICD-10-CM | POA: Diagnosis not present

## 2015-06-04 DIAGNOSIS — M545 Low back pain: Secondary | ICD-10-CM | POA: Diagnosis not present

## 2015-06-04 DIAGNOSIS — M6281 Muscle weakness (generalized): Secondary | ICD-10-CM | POA: Diagnosis not present

## 2015-06-09 DIAGNOSIS — M6281 Muscle weakness (generalized): Secondary | ICD-10-CM | POA: Diagnosis not present

## 2015-06-09 DIAGNOSIS — E669 Obesity, unspecified: Secondary | ICD-10-CM | POA: Diagnosis not present

## 2015-06-09 DIAGNOSIS — M546 Pain in thoracic spine: Secondary | ICD-10-CM | POA: Diagnosis not present

## 2015-06-09 DIAGNOSIS — M545 Low back pain: Secondary | ICD-10-CM | POA: Diagnosis not present

## 2015-06-11 DIAGNOSIS — M6281 Muscle weakness (generalized): Secondary | ICD-10-CM | POA: Diagnosis not present

## 2015-06-11 DIAGNOSIS — M546 Pain in thoracic spine: Secondary | ICD-10-CM | POA: Diagnosis not present

## 2015-06-11 DIAGNOSIS — M545 Low back pain: Secondary | ICD-10-CM | POA: Diagnosis not present

## 2015-06-16 DIAGNOSIS — M546 Pain in thoracic spine: Secondary | ICD-10-CM | POA: Diagnosis not present

## 2015-06-16 DIAGNOSIS — M6281 Muscle weakness (generalized): Secondary | ICD-10-CM | POA: Diagnosis not present

## 2015-06-16 DIAGNOSIS — M545 Low back pain: Secondary | ICD-10-CM | POA: Diagnosis not present

## 2015-06-18 DIAGNOSIS — M6281 Muscle weakness (generalized): Secondary | ICD-10-CM | POA: Diagnosis not present

## 2015-06-18 DIAGNOSIS — M546 Pain in thoracic spine: Secondary | ICD-10-CM | POA: Diagnosis not present

## 2015-06-18 DIAGNOSIS — M545 Low back pain: Secondary | ICD-10-CM | POA: Diagnosis not present

## 2015-06-23 DIAGNOSIS — M6281 Muscle weakness (generalized): Secondary | ICD-10-CM | POA: Diagnosis not present

## 2015-06-23 DIAGNOSIS — M545 Low back pain: Secondary | ICD-10-CM | POA: Diagnosis not present

## 2015-06-23 DIAGNOSIS — M546 Pain in thoracic spine: Secondary | ICD-10-CM | POA: Diagnosis not present

## 2015-06-25 DIAGNOSIS — M545 Low back pain: Secondary | ICD-10-CM | POA: Diagnosis not present

## 2015-06-25 DIAGNOSIS — M546 Pain in thoracic spine: Secondary | ICD-10-CM | POA: Diagnosis not present

## 2015-06-25 DIAGNOSIS — M6281 Muscle weakness (generalized): Secondary | ICD-10-CM | POA: Diagnosis not present

## 2015-07-02 DIAGNOSIS — M6281 Muscle weakness (generalized): Secondary | ICD-10-CM | POA: Diagnosis not present

## 2015-07-02 DIAGNOSIS — M545 Low back pain: Secondary | ICD-10-CM | POA: Diagnosis not present

## 2015-07-02 DIAGNOSIS — M546 Pain in thoracic spine: Secondary | ICD-10-CM | POA: Diagnosis not present

## 2015-07-17 DIAGNOSIS — L02439 Carbuncle of limb, unspecified: Secondary | ICD-10-CM | POA: Diagnosis not present

## 2015-08-01 DIAGNOSIS — G40909 Epilepsy, unspecified, not intractable, without status epilepticus: Secondary | ICD-10-CM | POA: Diagnosis not present

## 2015-08-01 DIAGNOSIS — Z79899 Other long term (current) drug therapy: Secondary | ICD-10-CM | POA: Diagnosis not present

## 2015-08-01 DIAGNOSIS — N760 Acute vaginitis: Secondary | ICD-10-CM | POA: Diagnosis not present

## 2015-08-01 DIAGNOSIS — R1030 Lower abdominal pain, unspecified: Secondary | ICD-10-CM | POA: Diagnosis not present

## 2015-08-17 DIAGNOSIS — F419 Anxiety disorder, unspecified: Secondary | ICD-10-CM | POA: Diagnosis not present

## 2015-08-17 DIAGNOSIS — Z79899 Other long term (current) drug therapy: Secondary | ICD-10-CM | POA: Diagnosis not present

## 2015-08-17 DIAGNOSIS — G4701 Insomnia due to medical condition: Secondary | ICD-10-CM | POA: Diagnosis not present

## 2015-08-17 DIAGNOSIS — G40919 Epilepsy, unspecified, intractable, without status epilepticus: Secondary | ICD-10-CM | POA: Diagnosis not present

## 2015-09-15 DIAGNOSIS — Z79899 Other long term (current) drug therapy: Secondary | ICD-10-CM | POA: Diagnosis not present

## 2015-09-15 DIAGNOSIS — F17200 Nicotine dependence, unspecified, uncomplicated: Secondary | ICD-10-CM | POA: Diagnosis not present

## 2015-09-15 DIAGNOSIS — R300 Dysuria: Secondary | ICD-10-CM | POA: Diagnosis not present

## 2015-09-15 DIAGNOSIS — Z87440 Personal history of urinary (tract) infections: Secondary | ICD-10-CM | POA: Diagnosis not present

## 2015-09-15 DIAGNOSIS — R569 Unspecified convulsions: Secondary | ICD-10-CM | POA: Diagnosis not present

## 2015-12-09 DIAGNOSIS — F17200 Nicotine dependence, unspecified, uncomplicated: Secondary | ICD-10-CM | POA: Diagnosis not present

## 2015-12-09 DIAGNOSIS — N3001 Acute cystitis with hematuria: Secondary | ICD-10-CM | POA: Diagnosis not present

## 2015-12-09 DIAGNOSIS — Z114 Encounter for screening for human immunodeficiency virus [HIV]: Secondary | ICD-10-CM | POA: Diagnosis not present

## 2015-12-09 DIAGNOSIS — R569 Unspecified convulsions: Secondary | ICD-10-CM | POA: Diagnosis not present

## 2015-12-09 DIAGNOSIS — A64 Unspecified sexually transmitted disease: Secondary | ICD-10-CM | POA: Diagnosis not present

## 2015-12-09 DIAGNOSIS — R030 Elevated blood-pressure reading, without diagnosis of hypertension: Secondary | ICD-10-CM | POA: Diagnosis not present

## 2015-12-09 DIAGNOSIS — Z79899 Other long term (current) drug therapy: Secondary | ICD-10-CM | POA: Diagnosis not present

## 2015-12-09 DIAGNOSIS — K759 Inflammatory liver disease, unspecified: Secondary | ICD-10-CM | POA: Diagnosis not present

## 2016-01-05 DIAGNOSIS — Z113 Encounter for screening for infections with a predominantly sexual mode of transmission: Secondary | ICD-10-CM | POA: Diagnosis not present

## 2016-01-05 DIAGNOSIS — Z202 Contact with and (suspected) exposure to infections with a predominantly sexual mode of transmission: Secondary | ICD-10-CM | POA: Diagnosis not present

## 2016-02-29 DIAGNOSIS — R102 Pelvic and perineal pain: Secondary | ICD-10-CM | POA: Diagnosis not present

## 2016-04-12 DIAGNOSIS — Z79899 Other long term (current) drug therapy: Secondary | ICD-10-CM | POA: Diagnosis not present

## 2016-04-12 DIAGNOSIS — G40919 Epilepsy, unspecified, intractable, without status epilepticus: Secondary | ICD-10-CM | POA: Diagnosis not present

## 2016-04-12 DIAGNOSIS — F419 Anxiety disorder, unspecified: Secondary | ICD-10-CM | POA: Diagnosis not present

## 2016-04-12 DIAGNOSIS — G4701 Insomnia due to medical condition: Secondary | ICD-10-CM | POA: Diagnosis not present

## 2016-04-15 DIAGNOSIS — R300 Dysuria: Secondary | ICD-10-CM | POA: Diagnosis not present

## 2016-04-15 DIAGNOSIS — B379 Candidiasis, unspecified: Secondary | ICD-10-CM | POA: Diagnosis not present

## 2016-04-15 DIAGNOSIS — Z6828 Body mass index (BMI) 28.0-28.9, adult: Secondary | ICD-10-CM | POA: Diagnosis not present

## 2016-05-13 DIAGNOSIS — Z23 Encounter for immunization: Secondary | ICD-10-CM | POA: Diagnosis not present

## 2016-05-19 DIAGNOSIS — G40909 Epilepsy, unspecified, not intractable, without status epilepticus: Secondary | ICD-10-CM | POA: Diagnosis not present

## 2016-05-19 DIAGNOSIS — L237 Allergic contact dermatitis due to plants, except food: Secondary | ICD-10-CM | POA: Diagnosis not present

## 2016-05-19 DIAGNOSIS — Z79899 Other long term (current) drug therapy: Secondary | ICD-10-CM | POA: Diagnosis not present

## 2016-06-15 DIAGNOSIS — B356 Tinea cruris: Secondary | ICD-10-CM | POA: Diagnosis not present

## 2016-06-15 DIAGNOSIS — A64 Unspecified sexually transmitted disease: Secondary | ICD-10-CM | POA: Diagnosis not present

## 2016-06-15 DIAGNOSIS — Z711 Person with feared health complaint in whom no diagnosis is made: Secondary | ICD-10-CM | POA: Diagnosis not present

## 2016-06-15 DIAGNOSIS — Z202 Contact with and (suspected) exposure to infections with a predominantly sexual mode of transmission: Secondary | ICD-10-CM | POA: Diagnosis not present

## 2016-06-15 DIAGNOSIS — K759 Inflammatory liver disease, unspecified: Secondary | ICD-10-CM | POA: Diagnosis not present

## 2016-06-15 DIAGNOSIS — Z114 Encounter for screening for human immunodeficiency virus [HIV]: Secondary | ICD-10-CM | POA: Diagnosis not present

## 2016-06-15 DIAGNOSIS — R300 Dysuria: Secondary | ICD-10-CM | POA: Diagnosis not present

## 2016-08-04 DIAGNOSIS — Z6826 Body mass index (BMI) 26.0-26.9, adult: Secondary | ICD-10-CM | POA: Diagnosis not present

## 2016-08-04 DIAGNOSIS — R102 Pelvic and perineal pain: Secondary | ICD-10-CM | POA: Diagnosis not present

## 2016-08-05 DIAGNOSIS — G40911 Epilepsy, unspecified, intractable, with status epilepticus: Secondary | ICD-10-CM | POA: Diagnosis present

## 2016-08-05 DIAGNOSIS — D72829 Elevated white blood cell count, unspecified: Secondary | ICD-10-CM | POA: Diagnosis not present

## 2016-08-05 DIAGNOSIS — R402431 Glasgow coma scale score 3-8, in the field [EMT or ambulance]: Secondary | ICD-10-CM | POA: Diagnosis not present

## 2016-08-05 DIAGNOSIS — R918 Other nonspecific abnormal finding of lung field: Secondary | ICD-10-CM | POA: Diagnosis not present

## 2016-08-05 DIAGNOSIS — R569 Unspecified convulsions: Secondary | ICD-10-CM | POA: Diagnosis not present

## 2016-08-05 DIAGNOSIS — R000 Tachycardia, unspecified: Secondary | ICD-10-CM | POA: Diagnosis not present

## 2016-08-05 DIAGNOSIS — E874 Mixed disorder of acid-base balance: Secondary | ICD-10-CM | POA: Diagnosis present

## 2016-08-05 DIAGNOSIS — J45902 Unspecified asthma with status asthmaticus: Secondary | ICD-10-CM | POA: Diagnosis not present

## 2016-08-05 DIAGNOSIS — Z9889 Other specified postprocedural states: Secondary | ICD-10-CM | POA: Diagnosis not present

## 2016-08-05 DIAGNOSIS — J9600 Acute respiratory failure, unspecified whether with hypoxia or hypercapnia: Secondary | ICD-10-CM | POA: Diagnosis not present

## 2016-08-05 DIAGNOSIS — Z9981 Dependence on supplemental oxygen: Secondary | ICD-10-CM | POA: Diagnosis not present

## 2016-08-05 DIAGNOSIS — G40901 Epilepsy, unspecified, not intractable, with status epilepticus: Secondary | ICD-10-CM | POA: Diagnosis not present

## 2016-08-05 DIAGNOSIS — I482 Chronic atrial fibrillation: Secondary | ICD-10-CM | POA: Diagnosis not present

## 2016-08-05 DIAGNOSIS — R404 Transient alteration of awareness: Secondary | ICD-10-CM | POA: Diagnosis not present

## 2016-08-05 DIAGNOSIS — R509 Fever, unspecified: Secondary | ICD-10-CM | POA: Diagnosis not present

## 2016-08-05 DIAGNOSIS — Z8669 Personal history of other diseases of the nervous system and sense organs: Secondary | ICD-10-CM | POA: Diagnosis not present

## 2016-08-05 DIAGNOSIS — R061 Stridor: Secondary | ICD-10-CM | POA: Diagnosis not present

## 2016-08-05 DIAGNOSIS — E872 Acidosis: Secondary | ICD-10-CM | POA: Diagnosis not present

## 2016-08-05 DIAGNOSIS — I959 Hypotension, unspecified: Secondary | ICD-10-CM | POA: Diagnosis not present

## 2016-08-05 DIAGNOSIS — R930 Abnormal findings on diagnostic imaging of skull and head, not elsewhere classified: Secondary | ICD-10-CM | POA: Diagnosis not present

## 2016-08-05 DIAGNOSIS — Z79899 Other long term (current) drug therapy: Secondary | ICD-10-CM | POA: Diagnosis not present

## 2016-08-15 DIAGNOSIS — F419 Anxiety disorder, unspecified: Secondary | ICD-10-CM | POA: Diagnosis not present

## 2016-08-15 DIAGNOSIS — G40411 Other generalized epilepsy and epileptic syndromes, intractable, with status epilepticus: Secondary | ICD-10-CM | POA: Diagnosis not present

## 2016-08-15 DIAGNOSIS — Z79899 Other long term (current) drug therapy: Secondary | ICD-10-CM | POA: Diagnosis not present

## 2016-08-15 DIAGNOSIS — G4701 Insomnia due to medical condition: Secondary | ICD-10-CM | POA: Diagnosis not present

## 2016-08-17 DIAGNOSIS — N941 Unspecified dyspareunia: Secondary | ICD-10-CM | POA: Diagnosis not present

## 2016-08-17 DIAGNOSIS — N83209 Unspecified ovarian cyst, unspecified side: Secondary | ICD-10-CM | POA: Diagnosis not present

## 2016-08-17 DIAGNOSIS — N83201 Unspecified ovarian cyst, right side: Secondary | ICD-10-CM | POA: Diagnosis not present

## 2016-08-17 DIAGNOSIS — Z6826 Body mass index (BMI) 26.0-26.9, adult: Secondary | ICD-10-CM | POA: Diagnosis not present

## 2016-09-13 DIAGNOSIS — R102 Pelvic and perineal pain: Secondary | ICD-10-CM | POA: Diagnosis not present

## 2016-09-13 DIAGNOSIS — D509 Iron deficiency anemia, unspecified: Secondary | ICD-10-CM | POA: Diagnosis not present

## 2016-09-20 DIAGNOSIS — N83201 Unspecified ovarian cyst, right side: Secondary | ICD-10-CM | POA: Diagnosis not present

## 2016-09-20 DIAGNOSIS — R102 Pelvic and perineal pain: Secondary | ICD-10-CM | POA: Diagnosis not present

## 2016-10-18 DIAGNOSIS — Z79899 Other long term (current) drug therapy: Secondary | ICD-10-CM | POA: Diagnosis not present

## 2016-10-18 DIAGNOSIS — F419 Anxiety disorder, unspecified: Secondary | ICD-10-CM | POA: Diagnosis not present

## 2016-10-18 DIAGNOSIS — G40411 Other generalized epilepsy and epileptic syndromes, intractable, with status epilepticus: Secondary | ICD-10-CM | POA: Diagnosis not present

## 2016-10-18 DIAGNOSIS — G4701 Insomnia due to medical condition: Secondary | ICD-10-CM | POA: Diagnosis not present

## 2016-10-29 DIAGNOSIS — G40909 Epilepsy, unspecified, not intractable, without status epilepticus: Secondary | ICD-10-CM | POA: Diagnosis not present

## 2016-10-29 DIAGNOSIS — R05 Cough: Secondary | ICD-10-CM | POA: Diagnosis not present

## 2016-10-29 DIAGNOSIS — J069 Acute upper respiratory infection, unspecified: Secondary | ICD-10-CM | POA: Diagnosis not present

## 2016-10-29 DIAGNOSIS — Z79899 Other long term (current) drug therapy: Secondary | ICD-10-CM | POA: Diagnosis not present

## 2016-11-21 DIAGNOSIS — M940 Chondrocostal junction syndrome [Tietze]: Secondary | ICD-10-CM | POA: Diagnosis not present

## 2016-11-21 DIAGNOSIS — F329 Major depressive disorder, single episode, unspecified: Secondary | ICD-10-CM | POA: Diagnosis not present

## 2016-11-21 DIAGNOSIS — R569 Unspecified convulsions: Secondary | ICD-10-CM | POA: Diagnosis not present

## 2016-12-16 DIAGNOSIS — F17200 Nicotine dependence, unspecified, uncomplicated: Secondary | ICD-10-CM | POA: Diagnosis not present

## 2016-12-16 DIAGNOSIS — G40909 Epilepsy, unspecified, not intractable, without status epilepticus: Secondary | ICD-10-CM | POA: Diagnosis not present

## 2016-12-16 DIAGNOSIS — S8011XA Contusion of right lower leg, initial encounter: Secondary | ICD-10-CM | POA: Diagnosis not present

## 2016-12-16 DIAGNOSIS — S8991XA Unspecified injury of right lower leg, initial encounter: Secondary | ICD-10-CM | POA: Diagnosis not present

## 2016-12-16 DIAGNOSIS — Z79899 Other long term (current) drug therapy: Secondary | ICD-10-CM | POA: Diagnosis not present

## 2016-12-16 DIAGNOSIS — S8001XA Contusion of right knee, initial encounter: Secondary | ICD-10-CM | POA: Diagnosis not present

## 2016-12-16 DIAGNOSIS — W228XXA Striking against or struck by other objects, initial encounter: Secondary | ICD-10-CM | POA: Diagnosis not present

## 2016-12-16 DIAGNOSIS — M25561 Pain in right knee: Secondary | ICD-10-CM | POA: Diagnosis not present

## 2016-12-19 ENCOUNTER — Other Ambulatory Visit (HOSPITAL_COMMUNITY): Payer: Self-pay | Admitting: Family Medicine

## 2016-12-19 ENCOUNTER — Ambulatory Visit (HOSPITAL_COMMUNITY)
Admission: RE | Admit: 2016-12-19 | Discharge: 2016-12-19 | Disposition: A | Discharge status: Home / Self Care | Payer: Medicare Other | Source: Ambulatory Visit | Attending: Family Medicine | Admitting: Family Medicine

## 2016-12-19 ENCOUNTER — Encounter (HOSPITAL_COMMUNITY): Payer: Self-pay

## 2016-12-19 DIAGNOSIS — M25561 Pain in right knee: Secondary | ICD-10-CM

## 2016-12-19 DIAGNOSIS — M7989 Other specified soft tissue disorders: Secondary | ICD-10-CM | POA: Diagnosis not present

## 2017-01-06 DIAGNOSIS — F17200 Nicotine dependence, unspecified, uncomplicated: Secondary | ICD-10-CM | POA: Diagnosis not present

## 2017-01-06 DIAGNOSIS — L237 Allergic contact dermatitis due to plants, except food: Secondary | ICD-10-CM | POA: Diagnosis not present

## 2017-01-06 DIAGNOSIS — Z79899 Other long term (current) drug therapy: Secondary | ICD-10-CM | POA: Diagnosis not present

## 2017-01-06 DIAGNOSIS — G40909 Epilepsy, unspecified, not intractable, without status epilepticus: Secondary | ICD-10-CM | POA: Diagnosis not present

## 2017-01-29 DIAGNOSIS — L237 Allergic contact dermatitis due to plants, except food: Secondary | ICD-10-CM | POA: Diagnosis not present

## 2017-01-29 DIAGNOSIS — Z205 Contact with and (suspected) exposure to viral hepatitis: Secondary | ICD-10-CM | POA: Diagnosis not present

## 2017-01-29 DIAGNOSIS — F17200 Nicotine dependence, unspecified, uncomplicated: Secondary | ICD-10-CM | POA: Diagnosis not present

## 2017-03-15 DIAGNOSIS — Z6823 Body mass index (BMI) 23.0-23.9, adult: Secondary | ICD-10-CM | POA: Diagnosis not present

## 2017-03-15 DIAGNOSIS — R634 Abnormal weight loss: Secondary | ICD-10-CM | POA: Diagnosis not present

## 2017-03-15 DIAGNOSIS — R102 Pelvic and perineal pain: Secondary | ICD-10-CM | POA: Diagnosis not present

## 2017-03-15 DIAGNOSIS — R5382 Chronic fatigue, unspecified: Secondary | ICD-10-CM | POA: Diagnosis not present

## 2017-03-20 DIAGNOSIS — Z6823 Body mass index (BMI) 23.0-23.9, adult: Secondary | ICD-10-CM | POA: Diagnosis not present

## 2017-03-20 DIAGNOSIS — N83292 Other ovarian cyst, left side: Secondary | ICD-10-CM | POA: Diagnosis not present

## 2017-03-20 DIAGNOSIS — Z90710 Acquired absence of both cervix and uterus: Secondary | ICD-10-CM | POA: Diagnosis not present

## 2017-03-20 DIAGNOSIS — R634 Abnormal weight loss: Secondary | ICD-10-CM | POA: Diagnosis not present

## 2017-03-20 DIAGNOSIS — R102 Pelvic and perineal pain: Secondary | ICD-10-CM | POA: Diagnosis not present

## 2017-03-28 DIAGNOSIS — F419 Anxiety disorder, unspecified: Secondary | ICD-10-CM | POA: Diagnosis not present

## 2017-03-28 DIAGNOSIS — E559 Vitamin D deficiency, unspecified: Secondary | ICD-10-CM | POA: Diagnosis not present

## 2017-03-28 DIAGNOSIS — E538 Deficiency of other specified B group vitamins: Secondary | ICD-10-CM | POA: Diagnosis not present

## 2017-03-28 DIAGNOSIS — G4701 Insomnia due to medical condition: Secondary | ICD-10-CM | POA: Diagnosis not present

## 2017-03-28 DIAGNOSIS — M818 Other osteoporosis without current pathological fracture: Secondary | ICD-10-CM | POA: Diagnosis not present

## 2017-03-28 DIAGNOSIS — G40411 Other generalized epilepsy and epileptic syndromes, intractable, with status epilepticus: Secondary | ICD-10-CM | POA: Diagnosis not present

## 2017-03-28 DIAGNOSIS — Z79899 Other long term (current) drug therapy: Secondary | ICD-10-CM | POA: Diagnosis not present

## 2017-03-28 DIAGNOSIS — R5383 Other fatigue: Secondary | ICD-10-CM | POA: Diagnosis not present

## 2017-05-02 DIAGNOSIS — N83201 Unspecified ovarian cyst, right side: Secondary | ICD-10-CM | POA: Diagnosis not present

## 2017-05-02 DIAGNOSIS — N83291 Other ovarian cyst, right side: Secondary | ICD-10-CM | POA: Diagnosis not present

## 2017-05-02 DIAGNOSIS — N83292 Other ovarian cyst, left side: Secondary | ICD-10-CM | POA: Diagnosis not present

## 2017-05-02 DIAGNOSIS — N83202 Unspecified ovarian cyst, left side: Secondary | ICD-10-CM | POA: Diagnosis not present

## 2017-05-02 DIAGNOSIS — R102 Pelvic and perineal pain: Secondary | ICD-10-CM | POA: Diagnosis not present

## 2017-05-03 DIAGNOSIS — I10 Essential (primary) hypertension: Secondary | ICD-10-CM | POA: Diagnosis not present

## 2017-05-03 DIAGNOSIS — G40309 Generalized idiopathic epilepsy and epileptic syndromes, not intractable, without status epilepticus: Secondary | ICD-10-CM | POA: Diagnosis not present

## 2017-06-11 DIAGNOSIS — Z23 Encounter for immunization: Secondary | ICD-10-CM | POA: Diagnosis not present

## 2017-06-12 DIAGNOSIS — F17210 Nicotine dependence, cigarettes, uncomplicated: Secondary | ICD-10-CM | POA: Diagnosis not present

## 2017-06-12 DIAGNOSIS — F17200 Nicotine dependence, unspecified, uncomplicated: Secondary | ICD-10-CM | POA: Diagnosis not present

## 2017-06-12 DIAGNOSIS — G40909 Epilepsy, unspecified, not intractable, without status epilepticus: Secondary | ICD-10-CM | POA: Diagnosis not present

## 2017-06-12 DIAGNOSIS — N3000 Acute cystitis without hematuria: Secondary | ICD-10-CM | POA: Diagnosis not present

## 2017-06-12 DIAGNOSIS — J209 Acute bronchitis, unspecified: Secondary | ICD-10-CM | POA: Diagnosis not present

## 2017-06-12 DIAGNOSIS — Z720 Tobacco use: Secondary | ICD-10-CM | POA: Diagnosis not present

## 2017-06-12 DIAGNOSIS — R05 Cough: Secondary | ICD-10-CM | POA: Diagnosis not present

## 2017-06-12 DIAGNOSIS — Z79899 Other long term (current) drug therapy: Secondary | ICD-10-CM | POA: Diagnosis not present

## 2017-06-20 DIAGNOSIS — J4540 Moderate persistent asthma, uncomplicated: Secondary | ICD-10-CM | POA: Diagnosis not present

## 2017-06-27 DIAGNOSIS — G4701 Insomnia due to medical condition: Secondary | ICD-10-CM | POA: Diagnosis not present

## 2017-06-27 DIAGNOSIS — F419 Anxiety disorder, unspecified: Secondary | ICD-10-CM | POA: Diagnosis not present

## 2017-06-27 DIAGNOSIS — Z79899 Other long term (current) drug therapy: Secondary | ICD-10-CM | POA: Diagnosis not present

## 2017-06-27 DIAGNOSIS — G40411 Other generalized epilepsy and epileptic syndromes, intractable, with status epilepticus: Secondary | ICD-10-CM | POA: Diagnosis not present

## 2017-08-29 DIAGNOSIS — J209 Acute bronchitis, unspecified: Secondary | ICD-10-CM | POA: Diagnosis not present

## 2017-08-29 DIAGNOSIS — R1032 Left lower quadrant pain: Secondary | ICD-10-CM | POA: Diagnosis not present

## 2017-08-29 DIAGNOSIS — R05 Cough: Secondary | ICD-10-CM | POA: Diagnosis not present

## 2017-08-29 DIAGNOSIS — F17200 Nicotine dependence, unspecified, uncomplicated: Secondary | ICD-10-CM | POA: Diagnosis not present

## 2017-08-29 DIAGNOSIS — R590 Localized enlarged lymph nodes: Secondary | ICD-10-CM | POA: Diagnosis not present

## 2017-08-29 DIAGNOSIS — Z79899 Other long term (current) drug therapy: Secondary | ICD-10-CM | POA: Diagnosis not present

## 2017-08-29 DIAGNOSIS — Z720 Tobacco use: Secondary | ICD-10-CM | POA: Diagnosis not present

## 2017-09-06 DIAGNOSIS — R102 Pelvic and perineal pain: Secondary | ICD-10-CM | POA: Diagnosis not present

## 2017-09-19 DIAGNOSIS — R102 Pelvic and perineal pain: Secondary | ICD-10-CM | POA: Diagnosis not present

## 2017-12-01 DIAGNOSIS — R1032 Left lower quadrant pain: Secondary | ICD-10-CM | POA: Diagnosis not present

## 2017-12-01 DIAGNOSIS — Z79899 Other long term (current) drug therapy: Secondary | ICD-10-CM | POA: Diagnosis not present

## 2017-12-01 DIAGNOSIS — F17200 Nicotine dependence, unspecified, uncomplicated: Secondary | ICD-10-CM | POA: Diagnosis not present

## 2017-12-01 DIAGNOSIS — K4090 Unilateral inguinal hernia, without obstruction or gangrene, not specified as recurrent: Secondary | ICD-10-CM | POA: Diagnosis not present

## 2017-12-05 DIAGNOSIS — T8189XA Other complications of procedures, not elsewhere classified, initial encounter: Secondary | ICD-10-CM | POA: Diagnosis not present

## 2017-12-07 DIAGNOSIS — R1900 Intra-abdominal and pelvic swelling, mass and lump, unspecified site: Secondary | ICD-10-CM | POA: Diagnosis not present

## 2017-12-07 DIAGNOSIS — G40909 Epilepsy, unspecified, not intractable, without status epilepticus: Secondary | ICD-10-CM | POA: Diagnosis not present

## 2017-12-07 DIAGNOSIS — N808 Other endometriosis: Secondary | ICD-10-CM | POA: Diagnosis not present

## 2017-12-07 DIAGNOSIS — R1909 Other intra-abdominal and pelvic swelling, mass and lump: Secondary | ICD-10-CM | POA: Diagnosis not present

## 2017-12-07 DIAGNOSIS — Z79899 Other long term (current) drug therapy: Secondary | ICD-10-CM | POA: Diagnosis not present

## 2017-12-07 DIAGNOSIS — Z90710 Acquired absence of both cervix and uterus: Secondary | ICD-10-CM | POA: Diagnosis not present

## 2017-12-20 DIAGNOSIS — G4709 Other insomnia: Secondary | ICD-10-CM | POA: Diagnosis not present

## 2017-12-20 DIAGNOSIS — Z79899 Other long term (current) drug therapy: Secondary | ICD-10-CM | POA: Diagnosis not present

## 2017-12-20 DIAGNOSIS — G40311 Generalized idiopathic epilepsy and epileptic syndromes, intractable, with status epilepticus: Secondary | ICD-10-CM | POA: Diagnosis not present

## 2017-12-20 DIAGNOSIS — F419 Anxiety disorder, unspecified: Secondary | ICD-10-CM | POA: Diagnosis not present

## 2018-03-08 DIAGNOSIS — N3081 Other cystitis with hematuria: Secondary | ICD-10-CM | POA: Diagnosis not present

## 2018-06-29 DIAGNOSIS — Z79899 Other long term (current) drug therapy: Secondary | ICD-10-CM | POA: Diagnosis not present

## 2018-06-29 DIAGNOSIS — G40311 Generalized idiopathic epilepsy and epileptic syndromes, intractable, with status epilepticus: Secondary | ICD-10-CM | POA: Diagnosis not present

## 2018-06-29 DIAGNOSIS — F419 Anxiety disorder, unspecified: Secondary | ICD-10-CM | POA: Diagnosis not present

## 2018-06-29 DIAGNOSIS — G4709 Other insomnia: Secondary | ICD-10-CM | POA: Diagnosis not present

## 2018-08-30 DIAGNOSIS — R066 Hiccough: Secondary | ICD-10-CM | POA: Diagnosis not present

## 2018-08-30 DIAGNOSIS — Z1389 Encounter for screening for other disorder: Secondary | ICD-10-CM | POA: Diagnosis not present

## 2018-08-30 DIAGNOSIS — Z0000 Encounter for general adult medical examination without abnormal findings: Secondary | ICD-10-CM | POA: Diagnosis not present

## 2018-08-30 DIAGNOSIS — Z131 Encounter for screening for diabetes mellitus: Secondary | ICD-10-CM | POA: Diagnosis not present

## 2018-08-30 DIAGNOSIS — N3081 Other cystitis with hematuria: Secondary | ICD-10-CM | POA: Diagnosis not present

## 2018-08-31 DIAGNOSIS — N760 Acute vaginitis: Secondary | ICD-10-CM | POA: Diagnosis not present

## 2018-09-20 DIAGNOSIS — R232 Flushing: Secondary | ICD-10-CM | POA: Diagnosis not present

## 2018-09-20 DIAGNOSIS — R634 Abnormal weight loss: Secondary | ICD-10-CM | POA: Diagnosis not present

## 2018-09-20 DIAGNOSIS — N738 Other specified female pelvic inflammatory diseases: Secondary | ICD-10-CM | POA: Diagnosis not present

## 2018-09-20 DIAGNOSIS — N739 Female pelvic inflammatory disease, unspecified: Secondary | ICD-10-CM | POA: Diagnosis not present

## 2018-09-20 DIAGNOSIS — E318 Other polyglandular dysfunction: Secondary | ICD-10-CM | POA: Diagnosis not present

## 2018-10-26 ENCOUNTER — Inpatient Hospital Stay (HOSPITAL_COMMUNITY)
Admission: AD | Admit: 2018-10-26 | Discharge: 2018-10-30 | DRG: 871 | Disposition: A | Discharge status: Home / Self Care | Payer: Medicare Other | Source: Other Acute Inpatient Hospital | Attending: Internal Medicine | Admitting: Internal Medicine

## 2018-10-26 ENCOUNTER — Inpatient Hospital Stay (HOSPITAL_COMMUNITY): Payer: Medicare Other

## 2018-10-26 DIAGNOSIS — J1000 Influenza due to other identified influenza virus with unspecified type of pneumonia: Secondary | ICD-10-CM | POA: Diagnosis present

## 2018-10-26 DIAGNOSIS — J9601 Acute respiratory failure with hypoxia: Secondary | ICD-10-CM | POA: Diagnosis not present

## 2018-10-26 DIAGNOSIS — A4189 Other specified sepsis: Principal | ICD-10-CM | POA: Diagnosis present

## 2018-10-26 DIAGNOSIS — R569 Unspecified convulsions: Secondary | ICD-10-CM | POA: Diagnosis not present

## 2018-10-26 DIAGNOSIS — G9389 Other specified disorders of brain: Secondary | ICD-10-CM | POA: Diagnosis not present

## 2018-10-26 DIAGNOSIS — R110 Nausea: Secondary | ICD-10-CM | POA: Diagnosis not present

## 2018-10-26 DIAGNOSIS — F17290 Nicotine dependence, other tobacco product, uncomplicated: Secondary | ICD-10-CM | POA: Diagnosis present

## 2018-10-26 DIAGNOSIS — R4020 Unspecified coma: Secondary | ICD-10-CM | POA: Diagnosis not present

## 2018-10-26 DIAGNOSIS — G92 Toxic encephalopathy: Secondary | ICD-10-CM | POA: Diagnosis present

## 2018-10-26 DIAGNOSIS — J322 Chronic ethmoidal sinusitis: Secondary | ICD-10-CM | POA: Diagnosis not present

## 2018-10-26 DIAGNOSIS — Z9911 Dependence on respirator [ventilator] status: Secondary | ICD-10-CM | POA: Diagnosis not present

## 2018-10-26 DIAGNOSIS — Z01818 Encounter for other preprocedural examination: Secondary | ICD-10-CM

## 2018-10-26 DIAGNOSIS — Z8669 Personal history of other diseases of the nervous system and sense organs: Secondary | ICD-10-CM

## 2018-10-26 DIAGNOSIS — R7881 Bacteremia: Secondary | ICD-10-CM | POA: Diagnosis not present

## 2018-10-26 DIAGNOSIS — F1290 Cannabis use, unspecified, uncomplicated: Secondary | ICD-10-CM | POA: Diagnosis present

## 2018-10-26 DIAGNOSIS — F17210 Nicotine dependence, cigarettes, uncomplicated: Secondary | ICD-10-CM | POA: Diagnosis not present

## 2018-10-26 DIAGNOSIS — G40911 Epilepsy, unspecified, intractable, with status epilepticus: Secondary | ICD-10-CM | POA: Diagnosis present

## 2018-10-26 DIAGNOSIS — Z98891 History of uterine scar from previous surgery: Secondary | ICD-10-CM | POA: Diagnosis not present

## 2018-10-26 DIAGNOSIS — J90 Pleural effusion, not elsewhere classified: Secondary | ICD-10-CM | POA: Diagnosis not present

## 2018-10-26 DIAGNOSIS — Z720 Tobacco use: Secondary | ICD-10-CM | POA: Diagnosis not present

## 2018-10-26 DIAGNOSIS — J13 Pneumonia due to Streptococcus pneumoniae: Secondary | ICD-10-CM | POA: Diagnosis not present

## 2018-10-26 DIAGNOSIS — J09X2 Influenza due to identified novel influenza A virus with other respiratory manifestations: Secondary | ICD-10-CM | POA: Diagnosis not present

## 2018-10-26 DIAGNOSIS — R0902 Hypoxemia: Secondary | ICD-10-CM | POA: Diagnosis not present

## 2018-10-26 DIAGNOSIS — R404 Transient alteration of awareness: Secondary | ICD-10-CM | POA: Diagnosis not present

## 2018-10-26 DIAGNOSIS — Z79899 Other long term (current) drug therapy: Secondary | ICD-10-CM | POA: Diagnosis not present

## 2018-10-26 DIAGNOSIS — G40909 Epilepsy, unspecified, not intractable, without status epilepticus: Secondary | ICD-10-CM | POA: Diagnosis not present

## 2018-10-26 DIAGNOSIS — J101 Influenza due to other identified influenza virus with other respiratory manifestations: Secondary | ICD-10-CM | POA: Diagnosis not present

## 2018-10-26 DIAGNOSIS — G40901 Epilepsy, unspecified, not intractable, with status epilepticus: Secondary | ICD-10-CM | POA: Diagnosis not present

## 2018-10-26 DIAGNOSIS — R05 Cough: Secondary | ICD-10-CM | POA: Diagnosis not present

## 2018-10-26 DIAGNOSIS — J111 Influenza due to unidentified influenza virus with other respiratory manifestations: Secondary | ICD-10-CM | POA: Diagnosis not present

## 2018-10-26 DIAGNOSIS — J986 Disorders of diaphragm: Secondary | ICD-10-CM | POA: Diagnosis not present

## 2018-10-26 DIAGNOSIS — R0689 Other abnormalities of breathing: Secondary | ICD-10-CM | POA: Diagnosis not present

## 2018-10-26 LAB — RAPID URINE DRUG SCREEN, HOSP PERFORMED
Amphetamines: NOT DETECTED
Barbiturates: NOT DETECTED
Benzodiazepines: POSITIVE — AB
COCAINE: NOT DETECTED
Opiates: NOT DETECTED
Tetrahydrocannabinol: POSITIVE — AB

## 2018-10-26 LAB — CBC WITH DIFFERENTIAL/PLATELET
ABS IMMATURE GRANULOCYTES: 0 10*3/uL (ref 0.00–0.07)
BASOS ABS: 0 10*3/uL (ref 0.0–0.1)
Basophils Relative: 0 %
Eosinophils Absolute: 0 10*3/uL (ref 0.0–0.5)
Eosinophils Relative: 0 %
HCT: 36.6 % (ref 36.0–46.0)
Hemoglobin: 12.2 g/dL (ref 12.0–15.0)
Lymphocytes Relative: 3 %
Lymphs Abs: 0.5 10*3/uL — ABNORMAL LOW (ref 0.7–4.0)
MCH: 30.8 pg (ref 26.0–34.0)
MCHC: 33.3 g/dL (ref 30.0–36.0)
MCV: 92.4 fL (ref 80.0–100.0)
Monocytes Absolute: 0.2 10*3/uL (ref 0.1–1.0)
Monocytes Relative: 1 %
Neutro Abs: 14.4 10*3/uL — ABNORMAL HIGH (ref 1.7–7.7)
Neutrophils Relative %: 96 %
Platelets: 147 10*3/uL — ABNORMAL LOW (ref 150–400)
RBC: 3.96 MIL/uL (ref 3.87–5.11)
RDW: 11.9 % (ref 11.5–15.5)
WBC: 15 10*3/uL — ABNORMAL HIGH (ref 4.0–10.5)
nRBC: 0 % (ref 0.0–0.2)
nRBC: 0 /100 WBC

## 2018-10-26 LAB — LACTIC ACID, PLASMA
Lactic Acid, Venous: 1.4 mmol/L (ref 0.5–1.9)
Lactic Acid, Venous: 1 mmol/L (ref 0.5–1.9)

## 2018-10-26 LAB — COMPREHENSIVE METABOLIC PANEL
ALT: 60 U/L — AB (ref 0–44)
AST: 68 U/L — ABNORMAL HIGH (ref 15–41)
Albumin: 3.3 g/dL — ABNORMAL LOW (ref 3.5–5.0)
Alkaline Phosphatase: 75 U/L (ref 38–126)
Anion gap: 6 (ref 5–15)
BUN: 7 mg/dL (ref 6–20)
CO2: 21 mmol/L — ABNORMAL LOW (ref 22–32)
CREATININE: 0.78 mg/dL (ref 0.44–1.00)
Calcium: 7.6 mg/dL — ABNORMAL LOW (ref 8.9–10.3)
Chloride: 111 mmol/L (ref 98–111)
GFR calc non Af Amer: >60 mL/min (ref >60)
Glucose, Bld: 105 mg/dL — ABNORMAL HIGH (ref 70–99)
Potassium: 3 mmol/L — ABNORMAL LOW (ref 3.5–5.1)
Sodium: 138 mmol/L (ref 135–145)
Total Bilirubin: 0.6 mg/dL (ref 0.3–1.2)
Total Protein: 6 g/dL — ABNORMAL LOW (ref 6.5–8.1)

## 2018-10-26 LAB — RESPIRATORY PANEL BY PCR
Adenovirus: NOT DETECTED
Bordetella pertussis: NOT DETECTED
Chlamydophila pneumoniae: NOT DETECTED
Coronavirus 229E: NOT DETECTED
Coronavirus HKU1: NOT DETECTED
Coronavirus NL63: NOT DETECTED
Coronavirus OC43: NOT DETECTED
Influenza A H1 2009: DETECTED — AB
Influenza B: NOT DETECTED
Metapneumovirus: NOT DETECTED
Mycoplasma pneumoniae: NOT DETECTED
Parainfluenza Virus 1: NOT DETECTED
Parainfluenza Virus 2: NOT DETECTED
Parainfluenza Virus 3: NOT DETECTED
Parainfluenza Virus 4: NOT DETECTED
Respiratory Syncytial Virus: NOT DETECTED
Rhinovirus / Enterovirus: NOT DETECTED

## 2018-10-26 LAB — GLUCOSE, CAPILLARY: GLUCOSE-CAPILLARY: 98 mg/dL (ref 70–99)

## 2018-10-26 LAB — PROTIME-INR
INR: 1.2 (ref 0.8–1.2)
PROTHROMBIN TIME: 14.7 s (ref 11.4–15.2)

## 2018-10-26 LAB — POCT I-STAT 7, (LYTES, BLD GAS, ICA,H+H)
Acid-base deficit: 5 mmol/L — ABNORMAL HIGH (ref 0.0–2.0)
Bicarbonate: 20.8 mmol/L (ref 20.0–28.0)
Calcium, Ion: 1.14 mmol/L — ABNORMAL LOW (ref 1.15–1.40)
HCT: 32 % — ABNORMAL LOW (ref 36.0–46.0)
Hemoglobin: 10.9 g/dL — ABNORMAL LOW (ref 12.0–15.0)
O2 Saturation: 99 %
Patient temperature: 100.4
Potassium: 2.9 mmol/L — ABNORMAL LOW (ref 3.5–5.1)
Sodium: 140 mmol/L (ref 135–145)
TCO2: 22 mmol/L (ref 22–32)
pCO2 arterial: 44 mmHg (ref 32.0–48.0)
pH, Arterial: 7.287 — ABNORMAL LOW (ref 7.350–7.450)
pO2, Arterial: 151 mmHg — ABNORMAL HIGH (ref 83.0–108.0)

## 2018-10-26 LAB — TRIGLYCERIDES: Triglycerides: 41 mg/dL (ref <150)

## 2018-10-26 LAB — PHOSPHORUS: Phosphorus: 3.1 mg/dL (ref 2.5–4.6)

## 2018-10-26 LAB — PHENYTOIN LEVEL, TOTAL: Phenytoin Lvl: 6.9 ug/mL — ABNORMAL LOW (ref 10.0–20.0)

## 2018-10-26 LAB — MRSA PCR SCREENING: MRSA by PCR: NEGATIVE

## 2018-10-26 LAB — MAGNESIUM: Magnesium: 2.1 mg/dL (ref 1.7–2.4)

## 2018-10-26 MED ORDER — FENTANYL CITRATE (PF) 100 MCG/2ML IJ SOLN
100.0000 ug | INTRAMUSCULAR | Status: DC | PRN
  Administered 2018-10-26 – 2018-10-27 (×3): 100 ug via INTRAVENOUS
  Filled 2018-10-26 (×3): qty 2

## 2018-10-26 MED ORDER — POTASSIUM CHLORIDE 20 MEQ/15ML (10%) PO SOLN
40.0000 meq | ORAL | Status: AC
  Administered 2018-10-26 – 2018-10-27 (×2): 40 meq
  Filled 2018-10-26 (×2): qty 30

## 2018-10-26 MED ORDER — HEPARIN SODIUM (PORCINE) 5000 UNIT/ML IJ SOLN
5000.0000 [IU] | Freq: Three times a day (TID) | INTRAMUSCULAR | Status: DC
  Administered 2018-10-26 – 2018-10-30 (×10): 5000 [IU] via SUBCUTANEOUS
  Filled 2018-10-26 (×11): qty 1

## 2018-10-26 MED ORDER — PHENYTOIN SODIUM 50 MG/ML IJ SOLN
100.0000 mg | Freq: Three times a day (TID) | INTRAMUSCULAR | Status: DC
  Administered 2018-10-26 – 2018-10-30 (×11): 100 mg via INTRAVENOUS
  Filled 2018-10-26 (×11): qty 2

## 2018-10-26 MED ORDER — LAMOTRIGINE 100 MG PO TABS
100.0000 mg | ORAL_TABLET | Freq: Two times a day (BID) | ORAL | Status: DC
  Administered 2018-10-26 – 2018-10-30 (×8): 100 mg via ORAL
  Filled 2018-10-26 (×8): qty 1

## 2018-10-26 MED ORDER — OSELTAMIVIR PHOSPHATE 75 MG PO CAPS
75.0000 mg | ORAL_CAPSULE | Freq: Two times a day (BID) | ORAL | Status: DC
  Administered 2018-10-26 – 2018-10-30 (×8): 75 mg via ORAL
  Filled 2018-10-26 (×9): qty 1

## 2018-10-26 MED ORDER — PROPOFOL 1000 MG/100ML IV EMUL
5.0000 ug/kg/min | INTRAVENOUS | Status: DC
  Administered 2018-10-26: 20 ug/kg/min via INTRAVENOUS
  Administered 2018-10-26: 40 ug/kg/min via INTRAVENOUS
  Administered 2018-10-27: 55 ug/kg/min via INTRAVENOUS
  Administered 2018-10-27: 40 ug/kg/min via INTRAVENOUS
  Administered 2018-10-27: 60 ug/kg/min via INTRAVENOUS
  Filled 2018-10-26 (×3): qty 100

## 2018-10-26 MED ORDER — FENTANYL CITRATE (PF) 100 MCG/2ML IJ SOLN
100.0000 ug | INTRAMUSCULAR | Status: DC | PRN
  Administered 2018-10-26 – 2018-10-27 (×2): 100 ug via INTRAVENOUS
  Filled 2018-10-26 (×3): qty 2

## 2018-10-26 NOTE — H&P (Signed)
NAME:  Lisa Boone, MRN:  [id number], DOB:  July 23, 1991, LOS: 0 ADMISSION DATE:  10/26/2018, CONSULTATION DATE:  10/26/2018  REFERRING MD: Lucienne Minks rocking him, CHIEF COMPLAINT: Seizure, acute hypoxemic respiratory failure  Brief History   28 year old female admitted to the intensive care unit as a transfer from [facility] hospital, diagnosis of influenza a, status epilepticus requiring intubation and mechanical ventilation.  Patient was transferred to [facility] for higher level of care and evaluation by neurology.  History of present illness   28 year old with a past medical history of prior brain surgery in the year 2000, history of seizure disorder on Lamictal and Dilantin at baseline.  She also has history of use of marijuana and documented history of medical marijuana use.  Patient has surgical history of 3 prior C-sections.  Also documentation of electronic cigarette/vape using.  Patient presented to outside hospital for evaluation of influenza-like illness.  Patient was screened positive for influenza A and discharged home from the emergency room.  At home patient became unresponsive with seizure-like activity 2 witnessed seizures at home.  EMS was called and there was additional seizure-like activity in route to the hospital.  Patient was intubated and placed on mechanical ventilation.  Urine drug screen at ED positive for marijuana and benzodiazepines.  Past Medical History   Past Medical History:  Diagnosis Date  . Seizures (HCC)      Significant Hospital Events   10/26/2018: Intubation, ICU admission  Consults:  10/26/2018: Neurology  Procedures:  Endotracheal tube 10/26/2018  Significant Diagnostic Tests:  Influenza PCR +10/26/2018 Urine drug screen: Pending  Micro Data:  Influenza PCR +10/26/2018  Antimicrobials:  Tamiflu  Interim history/subjective:  Intubated, sedated on mechanical ventilation  Objective   Blood pressure 111/77, pulse 71, temperature (!) 101.7 F (38.7  C), temperature source Oral, resp. rate 19, weight 65 kg, last menstrual period 04/22/2013, SpO2 (!) 71 %, unknown if currently breastfeeding.    Vent Mode: PRVC FiO2 (%):  [100 %] 100 % Set Rate:  [20 bmp] 20 bmp Vt Set:  [400 mL] 400 mL PEEP:  [5 cmH20] 5 cmH20 Plateau Pressure:  [15 cmH20] 15 cmH20  No intake or output data in the 24 hours ending 10/26/18 1829 Filed Weights   10/26/18 1815  Weight: 65 kg    Examination: General: Young female, resting in bed, unresponsive on propofol for sedation, intubated and mechanically ventilated HENT: NCAT, sclera clear, pupils reactive, no nystagmus Lungs: Clear to auscultation bilaterally, no crackles no wheeze, bilateral breath sounds Cardiovascular: Tachycardic, regular, S1-S2, no MRG Abdomen: Soft, nontender, nondistended, bowel sounds present Extremities: No significant lower extremity edema or obvious rash Neuro: Sedated on mechanical ventilation, responsive to pain pupils reactive cough gag present moves all 4 extremities when sedation is lightened GU: Foley in place Skin: multiple tattoos   Resolved Hospital Problem list    Assessment & Plan:   Acute hypoxemic respiratory failure requiring intubation and mechanical ventilation secondary to acute status epilepticus requiring intubation secondary to the inability to protection of her airway.  Additionally patient has influenza a which could contribute to her ongoing respiratory failure. - I suspect her seizure threshold was reduced with new infection with influenza A. -There is also question of noncompliance of antiepileptics per documentation from mother -Patient was started on Community-acquired pneumonia coverage antibiotics, ceftriaxone azithromycin -We will obtain repeat cultures here, sputum, blood -We will also repeat PCR RVAP panel. -Dilantin level was low at referral hospital. CT head with chronic encephalomalacia, much of the  left temporal lobe, postoperative changes  within the left temporal lobe -Will recheck ABG -Routine EEG planned for tomorrow morning per neurology - Antiepileptics per neurology - After EEG tomorrow and pending weaning parameters with SBT SAT will consider liberation from mechanical ventilator. - Repeat chest x-ray pending -Full repeat lab work pending, CBC CMP, lactic acid, Dilantin level, PT/INR and HIV screen - Urine drug screen pending  Sepsis secondary to influenza A -Will complete 5-day course of Tamiflu -Cultures pending -We will order respiratory viral panel. -We will hold any additional antibiotics at this time. -If there is hemodynamic change or compromise concerning for any ongoing septic shock or positivity and cultures will initiate broad-spectrum and work reveals   Medical sales representative:  Diet: N.p.o. Pain/Anxiety/Delirium protocol (if indicated): Propofol fentanyl for sedation VAP protocol: In place DVT prophylaxis: Heparin GI prophylaxis: Holding at this time Glucose control: We will monitor Mobility: Bedrest Code Status: Full Family Communication: Family not at bedside Disposition:   Labs    [place]: WBC 7.9 Lactic acid 8.7  Flu pcr + influenza A  CBC: No results for input(s): WBC, NEUTROABS, HGB, HCT, MCV, PLT in the last 168 hours.  Basic Metabolic Panel: No results for input(s): NA, K, CL, CO2, GLUCOSE, BUN, CREATININE, CALCIUM, MG, PHOS in the last 168 hours. GFR: CrCl cannot be calculated (Patient's most recent lab result is older than the maximum 21 days allowed.). No results for input(s): PROCALCITON, WBC, LATICACIDVEN in the last 168 hours.  Liver Function Tests: No results for input(s): AST, ALT, ALKPHOS, BILITOT, PROT, ALBUMIN in the last 168 hours. No results for input(s): LIPASE, AMYLASE in the last 168 hours. No results for input(s): AMMONIA in the last 168 hours.  ABG No results found for: PHART, PCO2ART, PO2ART, HCO3, TCO2, ACIDBASEDEF, O2SAT   Coagulation Profile: No results  for input(s): INR, PROTIME in the last 168 hours.  Cardiac Enzymes: No results for input(s): CKTOTAL, CKMB, CKMBINDEX, TROPONINI in the last 168 hours.  HbA1C: No results found for: HGBA1C  CBG: Recent Labs  Lab 10/26/18 1821  GLUCAP 98    Review of Systems:   Unable to obtain secondary to critical illness, intubation and mechanical ventilation  Past Medical History  She,  has a past medical history of Seizures (HCC).   Surgical History    Past Surgical History:  Procedure Laterality Date  . BRAIN SURGERY    . CESAREAN SECTION       Social History   reports that she has never smoked. She does not have any smokeless tobacco history on file. She reports that she does not drink alcohol or use drugs.   Family History   Her family history is not on file.   Unable to obtain secondary to critical illness, intubated and mechanically ventilated  Allergies No Known Allergies   Home Medications  Prior to Admission medications   Medication Sig Start Date End Date Taking? Authorizing Provider  LAMICTAL XR 200 MG TB24 Take 200 mg by mouth 2 (two) times daily. Patient takes  and  for a total of  twice daily 05/23/13   [provider]  LamoTRIgine (LAMICTAL XR) 100 MG TB24 Take 100 mg by mouth 2 (two) times daily. Patient takes  and  for a total of  twice daily    [provider]     This patient is critically ill with multiple organ system failure; which, requires frequent high complexity decision making, assessment, support, evaluation, and titration of therapies. This was completed  through the application of advanced monitoring technologies and extensive interpretation of multiple databases. During this encounter critical care time was devoted to patient care services described in this note for 37 minutes.   Josephine Igo, DO [facility] 10/26/2018 6:42 PM  Personal pager: [phone] If unanswered, please  page CCM On-call: #[phone]

## 2018-10-26 NOTE — Consult Note (Addendum)
Neurology Consultation  Reason for Consult: AMS secondary to seizures at OSH Referring Physician: Dr Tonia Brooms  CC: seizures  History is obtained from: Review of paper charts accompanying the patient  HPI: Lisa Boone is a 28 y.o. 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  She was offered treatment, denied treatment and left the ER, went home and reportedly had a seizure lasting 10-minute after which she was brought back to that same emergency room.  There was some question of having had aspiration into her lungs, and inability to protect airway for which she was intubated.  She was given 4 mg of Ativan that broke the seizure according to my phone conversation with the PA-C at that facility prior to transfer. Patient came in here intubated, on propofol. Patient is nonverbal providing history. The chart does not have any pertinent neurological history after 2013.  In the emergency room at [facility]- she was intubated, loaded with Dilantin, flu screen obtained which was presumably positive, Dilantin level drawn which was 1.4, started on antibiotics for presumable pneumonia as well.  ROS: Unable to obtain due to altered mental status.   Past Medical History:  Diagnosis Date  . Seizures (HCC)    No family history on file.  Social History:   reports that she has never smoked. She does not have any smokeless tobacco history on file. She reports that she does not drink alcohol or use drugs.  Medications  Current Facility-Administered Medications:  .  heparin injection 5,000 Units, 5,000 Units, Subcutaneous, Q8H, Duayne Cal, NP .  oseltamivir (TAMIFLU) capsule 75 mg, 75 mg, Oral, BID, Duayne Cal, NP .  propofol (DIPRIVAN) 1000 MG/100ML infusion, 5-80 mcg/kg/min, Intravenous, Titrated, Leslye Peer,  MD  Exam: Current vital signs: BP 111/77   Pulse 71   Temp (!) 101.7 F (38.7 C) (Oral)   Resp 19   Wt 65 kg   LMP 04/22/2013   SpO2 (!) 71%   BMI 22.44 kg/m  Vital signs in last 24 hours: Temp:  [101.7 F (38.7 C)] 101.7 F (38.7 C) (02/28 1815) Pulse Rate:  [71] 71 (02/28 1815) Resp:  [19] 19 (02/28 1815) BP: (111)/(77) 111/77 (02/28 1815) SpO2:  [71 %] 71 % (02/28 1815) FiO2 (%):  [100 %] 100 % (02/28 1824) Weight:  [65 kg] 65 kg (02/28 1815) General: She is sedated intubated.  On propofol. HEENT: Cephalic atraumatic CVS: S1-S2 heard regular rate rhythm Respiratory: Scattered rales Abdomen: Nondistended nontender Extremities: Well perfused, multiple tattoos on all extremities. Neurological exam Propofol is done for the duration of this exam. She is intubated As soon as the propofol was held, she started moving all 4 extremities spontaneously. She did not follow any commands She was nonverbal Cranial nerves: Pupils equal round reactive to light, roving eye movements, no gaze preference or forced gaze deviation noted, facial symmetry difficult to ascertain due to tube.  Breathing over the vent. She has a positive cough and gag.  Oculocephalics are present. Motor exam: Strong withdrawal to noxious stimulation in all 4.  On stopping propofol, moving all 4 spontaneously. Sensory exam as above Coordination cannot be tested due to mentation.  Labs I have reviewed labs in epic and the results pertinent to this consultation are: UDS positive for marijuana and benzos at the outside hospital but unclear if sample was drawn  after giving her the benzos for seizure control. Elevated lactate, normal white count.  Imaging I have reviewed the images obtained:  CT-scan of the brain- report in the chart, left temporal encephalomalacia and postoperative changes.  No acute changes reported on the report.  Attempted to view the CD-unable to open.  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 Phenytoin level was undetectable-he is supposed to be on phenytoin and lamotrigine. Outside hospital sent lamotrigine level as well. On my examination, she was encephalopathic but did not seem to be focal and by any means and did not seem to be in status epilepticus. We will get more information from the family when the right.   Impression: Status epilepticus-possibly resolved Intractable seizures Influenza Toxic metabolic encephalopathy in the setting of infection and status epilepticus  Recommendations: Phenytoin 100 mg 3 times daily Lamotrigine 100 mg twice daily Routine EEG in the morning MRI brain with and without contrast when possible Maintain seizure precautions Due to respiratory infection being confirmed, low suspicion for this being a primary CNS infection.  Likely reduce seizure threshold due to influenza. Management of toxic metabolic derangements per primary team as you are  -- Milon Dikes, MD [facility] Pager: [phone] If 7pm to 7am, please call on call as listed on AMION.   CRITICAL CARE ATTESTATION Performed by: Milon Dikes, MD Total critical care time: 30 minutes Critical care time was exclusive of separately billable procedures and treating other patients and/or supervising APPs/Residents/Students Critical care was necessary to treat or prevent imminent or life-threatening deterioration due to status epilepticus, seizures  This patient is critically ill and at significant risk for neurological worsening and/or death and care requires constant monitoring. Critical care was time spent personally by me on the following activities: development of treatment plan with patient and/or surrogate as well as nursing, discussions with  consultants, evaluation of patient's response to treatment, examination of patient, obtaining history from patient or surrogate, ordering and performing treatments and interventions, ordering and review of laboratory studies, ordering and review of radiographic studies, pulse oximetry, re-evaluation of patient's condition, participation in multidisciplinary rounds and medical decision making of high complexity in the care of this patient.

## 2018-10-27 ENCOUNTER — Inpatient Hospital Stay (HOSPITAL_COMMUNITY): Payer: Medicare Other

## 2018-10-27 ENCOUNTER — Encounter (HOSPITAL_COMMUNITY): Payer: Self-pay

## 2018-10-27 ENCOUNTER — Other Ambulatory Visit: Payer: Self-pay

## 2018-10-27 DIAGNOSIS — G40901 Epilepsy, unspecified, not intractable, with status epilepticus: Secondary | ICD-10-CM

## 2018-10-27 DIAGNOSIS — R110 Nausea: Secondary | ICD-10-CM

## 2018-10-27 DIAGNOSIS — R7881 Bacteremia: Secondary | ICD-10-CM

## 2018-10-27 LAB — BASIC METABOLIC PANEL
ANION GAP: 8 (ref 5–15)
BUN: 7 mg/dL (ref 6–20)
CO2: 21 mmol/L — ABNORMAL LOW (ref 22–32)
Calcium: 8.2 mg/dL — ABNORMAL LOW (ref 8.9–10.3)
Chloride: 109 mmol/L (ref 98–111)
Creatinine, Ser: 0.66 mg/dL (ref 0.44–1.00)
GFR calc Af Amer: >60 mL/min (ref >60)
GFR calc non Af Amer: >60 mL/min (ref >60)
Glucose, Bld: 99 mg/dL (ref 70–99)
Potassium: 3.7 mmol/L (ref 3.5–5.1)
SODIUM: 138 mmol/L (ref 135–145)

## 2018-10-27 LAB — CBC
HCT: 35.5 % — ABNORMAL LOW (ref 36.0–46.0)
Hemoglobin: 11.8 g/dL — ABNORMAL LOW (ref 12.0–15.0)
MCH: 31.1 pg (ref 26.0–34.0)
MCHC: 33.2 g/dL (ref 30.0–36.0)
MCV: 93.4 fL (ref 80.0–100.0)
NRBC: 0 % (ref 0.0–0.2)
Platelets: 129 10*3/uL — ABNORMAL LOW (ref 150–400)
RBC: 3.8 MIL/uL — ABNORMAL LOW (ref 3.87–5.11)
RDW: 12.2 % (ref 11.5–15.5)
WBC: 15.2 10*3/uL — ABNORMAL HIGH (ref 4.0–10.5)

## 2018-10-27 LAB — POCT I-STAT 7, (LYTES, BLD GAS, ICA,H+H)
Acid-base deficit: 4 mmol/L — ABNORMAL HIGH (ref 0.0–2.0)
Bicarbonate: 21.7 mmol/L (ref 20.0–28.0)
Calcium, Ion: 1.19 mmol/L (ref 1.15–1.40)
HCT: 33 % — ABNORMAL LOW (ref 36.0–46.0)
Hemoglobin: 11.2 g/dL — ABNORMAL LOW (ref 12.0–15.0)
O2 Saturation: 100 %
Patient temperature: 99
Potassium: 3.9 mmol/L (ref 3.5–5.1)
SODIUM: 139 mmol/L (ref 135–145)
TCO2: 23 mmol/L (ref 22–32)
pCO2 arterial: 41.4 mmHg (ref 32.0–48.0)
pH, Arterial: 7.329 — ABNORMAL LOW (ref 7.350–7.450)
pO2, Arterial: 211 mmHg — ABNORMAL HIGH (ref 83.0–108.0)

## 2018-10-27 LAB — PROCALCITONIN: PROCALCITONIN: 17.3 ng/mL

## 2018-10-27 LAB — PHOSPHORUS: Phosphorus: 1.7 mg/dL — ABNORMAL LOW (ref 2.5–4.6)

## 2018-10-27 LAB — MAGNESIUM: Magnesium: 2 mg/dL (ref 1.7–2.4)

## 2018-10-27 LAB — GLUCOSE, CAPILLARY: Glucose-Capillary: 78 mg/dL (ref 70–99)

## 2018-10-27 LAB — HIV ANTIBODY (ROUTINE TESTING W REFLEX): HIV Screen 4th Generation wRfx: NONREACTIVE

## 2018-10-27 MED ORDER — GADOBUTROL 1 MMOL/ML IV SOLN
6.0000 mL | Freq: Once | INTRAVENOUS | Status: AC | PRN
  Administered 2018-10-27: 6 mL via INTRAVENOUS

## 2018-10-27 MED ORDER — ACETAMINOPHEN 325 MG PO TABS
650.0000 mg | ORAL_TABLET | Freq: Four times a day (QID) | ORAL | Status: DC | PRN
  Administered 2018-10-27 (×2): 650 mg via ORAL
  Filled 2018-10-27 (×2): qty 2

## 2018-10-27 MED ORDER — LEVETIRACETAM IN NACL 1000 MG/100ML IV SOLN
1000.0000 mg | INTRAVENOUS | Status: AC
  Administered 2018-10-27: 1000 mg via INTRAVENOUS
  Filled 2018-10-27: qty 100

## 2018-10-27 MED ORDER — LEVETIRACETAM IN NACL 500 MG/100ML IV SOLN
500.0000 mg | Freq: Two times a day (BID) | INTRAVENOUS | Status: DC
  Administered 2018-10-27 – 2018-10-29 (×4): 500 mg via INTRAVENOUS
  Filled 2018-10-27 (×4): qty 100

## 2018-10-27 MED ORDER — ORAL CARE MOUTH RINSE
15.0000 mL | OROMUCOSAL | Status: DC
  Administered 2018-10-27 (×3): 15 mL via OROMUCOSAL

## 2018-10-27 MED ORDER — CHLORHEXIDINE GLUCONATE 0.12% ORAL RINSE (MEDLINE KIT)
15.0000 mL | Freq: Two times a day (BID) | OROMUCOSAL | Status: DC
  Administered 2018-10-27 – 2018-10-28 (×5): 15 mL via OROMUCOSAL

## 2018-10-27 MED ORDER — SODIUM CHLORIDE 0.9 % IV SOLN
INTRAVENOUS | Status: DC | PRN
  Administered 2018-10-27: 1000 mL via INTRAVENOUS

## 2018-10-27 MED ORDER — ONDANSETRON HCL 4 MG/2ML IJ SOLN
4.0000 mg | Freq: Three times a day (TID) | INTRAMUSCULAR | Status: DC
  Administered 2018-10-27 – 2018-10-28 (×3): 4 mg via INTRAVENOUS
  Filled 2018-10-27 (×3): qty 2

## 2018-10-27 MED ORDER — AMOXICILLIN-POT CLAVULANATE 875-125 MG PO TABS
1.0000 | ORAL_TABLET | Freq: Two times a day (BID) | ORAL | Status: DC
  Administered 2018-10-27 – 2018-10-30 (×7): 1 via ORAL
  Filled 2018-10-27 (×8): qty 1

## 2018-10-27 MED ORDER — GUAIFENESIN-CODEINE 100-10 MG/5ML PO SOLN
10.0000 mL | Freq: Four times a day (QID) | ORAL | Status: DC | PRN
  Administered 2018-10-27 – 2018-10-29 (×5): 10 mL via ORAL
  Filled 2018-10-27 (×6): qty 10

## 2018-10-27 NOTE — Progress Notes (Signed)
Prelim EEG read as it is being done concerning for asymmetrical right sided rhythmic slowing episode and possible electrographic seizure with no clinical correlate. Combined with the MRI finding of possible right mesial temporal abnormality, this might reflect ongoing electrographic disturbance. She is on Dilantin and Lamictal I will add Keopra - load 1g IV now, followed by 500 mg BID. Start LTM EEG  -- Milon Dikes, MD [facility] Pager: [phone] If 7pm to 7am, please call on call as listed on AMION.

## 2018-10-27 NOTE — Progress Notes (Addendum)
MEDICATION RELATED CONSULT NOTE - INITIAL   Pharmacy Consult for phenytoin Indication: seizures  Patient Measurements: Height: 5\' 7"  (170.2 cm) Weight: 142 lb 10.2 oz (64.7 kg) IBW/kg (Calculated) : 61.6  Vital Signs: Temp: 99.1 F (37.3 C) (02/29 0800) Temp Source: Oral (02/29 0800) BP: 118/76 (02/29 0819) Pulse Rate: 112 (02/29 0819) Intake/Output from previous day: 02/28 0701 - 02/29 0700 In: 235.8 [I.V.:235.8] Out: 1620 [Urine:1615; Emesis/NG output:5] Intake/Output from this shift: Total I/O In: 33.4 [I.V.:33.4] Out: -   Labs: Recent Labs    10/26/18 1941 10/26/18 2116 10/27/18 0427  WBC 15.0*  --   --   HGB 12.2 10.9* 11.2*  HCT 36.6 32.0* 33.0*  PLT 147*  --   --   CREATININE 0.78  --   --   MG 2.1  --   --   PHOS 3.1  --   --   ALBUMIN 3.3*  --   --   PROT 6.0*  --   --   AST 68*  --   --   ALT 60*  --   --   ALKPHOS 75  --   --   BILITOT 0.6  --   --      Assessment: 28 year old female with intractable seizures. She had a prior operative procedure involving temporal lobectomy and hippocampectomy in 2011 to attempt to achieve seizure cessation. She is on Lamictal for seizures prior to admission. She was loaded with phenytoin prior to admission at outside hospital. Corrected phenytoin level = 6.9 (subetherapeutic).  Goal of Therapy:  Seizure cessation Phenytoin 10-20 mcg/mL  Plan:  -Continue phenytoin 100 mg po TID  -Check level tomorrow morning and at steady [address] 10/27/2018,9:40 AM

## 2018-10-27 NOTE — Progress Notes (Addendum)
Neurology Progress Note   S:// No acute events overnight.  O:// Current vital signs: BP 108/69   Pulse (!) 108   Temp 99.3 F (37.4 C)   Resp 20   Ht '5\' 7"'$  (1.702 m)   Wt 64.7 kg   LMP 04/22/2013   SpO2 100%   BMI 22.34 kg/m  Vital signs in last 24 hours: Temp:  [99.3 F (37.4 C)-101.7 F (38.7 C)] 99.3 F (37.4 C) (02/29 0400) Pulse Rate:  [71-112] 108 (02/29 0700) Resp:  [9-28] 20 (02/29 0700) BP: (95-118)/(61-81) 108/69 (02/29 0700) SpO2:  [71 %-100 %] 100 % (02/29 0700) FiO2 (%):  [40 %-100 %] 40 % (02/29 0408) Weight:  [64.7 kg-65 kg] 64.7 kg (02/29 0430) General: Sedated on propofol, intubated.  Propofol held for exam. HEENT: Normocephalic atraumatic, ET tube in place CVS: S1-S2 heard, mildly tachycardic Respiratory: Intubated, ventilated Extremities: No edema Neurological exam Sedated on propofol intubated.  Propofol held for exam. Spontaneously moves all 4 extremities within minutes of propofol being held. Does not follow any commands but does open eyes to voice. Cranial nerves: Pupils equal round reactive to light, no gaze deviation or preference, does not blink to threat from either side.  Facial symmetry difficult to ascertain.  Breathing over the ventilator, corneal reflexes present, oculocephalics intact. Motor exam: Does not follow commands but is moving all 4 extremities without suggestion of focal weakness. Sensory exam: Withdraws to noxious simulation in all 4 extremities very purposefully Coordination: Difficult to assess  Medications  Current Facility-Administered Medications:  .  0.9 %  sodium chloride infusion, , Intravenous, PRN, Icard, Bradley L, DO, Last Rate: 10 mL/hr at 10/27/18 0500 .  chlorhexidine gluconate (MEDLINE KIT) (PERIDEX) 0.12 % solution 15 mL, 15 mL, Mouth Rinse, BID, Icard, Bradley L, DO, 15 mL at 10/27/18 0023 .  fentaNYL (SUBLIMAZE) injection 100 mcg, 100 mcg, Intravenous, Q15 min PRN, Corey Harold, NP, 100 mcg at 10/27/18  0305 .  fentaNYL (SUBLIMAZE) injection 100 mcg, 100 mcg, Intravenous, Q2H PRN, Corey Harold, NP, 100 mcg at 10/27/18 5093 .  heparin injection 5,000 Units, 5,000 Units, Subcutaneous, Q8H, Corey Harold, NP, 5,000 Units at 10/27/18 0548 .  lamoTRIgine (LAMICTAL) tablet 100 mg, 100 mg, Oral, BID, Amie Portland, MD, 100 mg at 10/26/18 2136 .  MEDLINE mouth rinse, 15 mL, Mouth Rinse, 10 times per day, Icard, Bradley L, DO, 15 mL at 10/27/18 0545 .  oseltamivir (TAMIFLU) capsule 75 mg, 75 mg, Oral, BID, Corey Harold, NP, 75 mg at 10/26/18 2056 .  phenytoin (DILANTIN) injection 100 mg, 100 mg, Intravenous, Q8H, Amie Portland, MD, 100 mg at 10/27/18 0541 .  propofol (DIPRIVAN) 1000 MG/100ML infusion, 5-80 mcg/kg/min, Intravenous, Titrated, Collene Gobble, MD, Last Rate: 23.4 mL/hr at 10/27/18 0648, 60 mcg/kg/min at 10/27/18 0648 Labs CBC    Component Value Date/Time   WBC 15.0 (H) 10/26/2018 1941   RBC 3.96 10/26/2018 1941   HGB 11.2 (L) 10/27/2018 0427   HCT 33.0 (L) 10/27/2018 0427   PLT 147 (L) 10/26/2018 1941   MCV 92.4 10/26/2018 1941   MCH 30.8 10/26/2018 1941   MCHC 33.3 10/26/2018 1941   RDW 11.9 10/26/2018 1941   LYMPHSABS 0.5 (L) 10/26/2018 1941   MONOABS 0.2 10/26/2018 1941   EOSABS 0.0 10/26/2018 1941   BASOSABS 0.0 10/26/2018 1941    CMP     Component Value Date/Time   NA 139 10/27/2018 0427   K 3.9 10/27/2018 0427   CL 111  10/26/2018 1941   CO2 21 (L) 10/26/2018 1941   GLUCOSE 105 (H) 10/26/2018 1941   BUN 7 10/26/2018 1941   CREATININE 0.78 10/26/2018 1941   CALCIUM 7.6 (L) 10/26/2018 1941   PROT 6.0 (L) 10/26/2018 1941   ALBUMIN 3.3 (L) 10/26/2018 1941   AST 68 (H) 10/26/2018 1941   ALT 60 (H) 10/26/2018 1941   ALKPHOS 75 10/26/2018 1941   BILITOT 0.6 10/26/2018 1941   GFRNONAA >60 10/26/2018 1941   GFRAA >60 10/26/2018 1941   Imaging I have reviewed images in epic and the results pertinent to this consultation are: MRI examination of the brain shows  a possible increased restricted diffusion in the right mesial temporal lobe/hippocampus-artifact versus possible sequela of seizure.  MRI also shows evidence of prior left temporal lobectomy with stable postop changes.  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 No family at the bedside at this time.  Did not see any family or meet any family at bedside last evening on arrival. She is influenza positive at the outside hospital. Her phenytoin level at the outside hospital was low/undetectable-she is supposed to be on phenytoin and lamotrigine. She was loaded with phenytoin at the outside hospital, started on lamotrigine and phenytoin here at [facility], ICU.  She is required some sedation due to agitation and is on propofol which was held for the exam.  Exam appears nonfocal but she is very encephalopathic. Clinical exam less likely to be reflective of subclinical status epilepticus, but will get EEG respective.  Impression: Status epilepticus Intractable seizures Influenza Toxic metabolic encephalopathy in the setting of possible respiratory illness and breakthrough seizure and status epilepticus from lowered threshold due to infection and possible medication noncompliance  Recommendations: Dilantin 100 3 times daily Lamotrigine 100 twice daily Routine EEG Maintain seizure precautions Treatment of the respiratory infection per primary team as you are Dilantin and albumin levels Pharmacy consult for dilantin dosing Today's level of Dilantin corrected-6.9  -- Amie Portland, MD [facility] Pager: [phone] If 7pm to 7am, please call on call as listed on AMION.  CRITICAL CARE ATTESTATION Performed by: Amie Portland, MD Total critical care time: 20  minutes Critical care time was exclusive of separately billable procedures and treating other patients and/or supervising APPs/Residents/Students Critical care was necessary to treat or prevent imminent or life-threatening deterioration due to seizrue, status, toxic metabolic encephalopathy  This patient is critically ill and at significant risk for neurological worsening and/or death and care requires constant monitoring. Critical care was time spent personally by me on the following activities: development of treatment plan with patient and/or surrogate as well as nursing, discussions with consultants, evaluation of patient's response to treatment, examination of patient, obtaining history from patient or surrogate, ordering and performing treatments and interventions, ordering and review of laboratory studies, ordering and review of radiographic studies, pulse oximetry, re-evaluation of patient's condition, participation in multidisciplinary rounds and medical decision making of high complexity in the care of this patient.

## 2018-10-27 NOTE — Progress Notes (Signed)
Patient to MRI with [facility] nurse, RT, transport, and Oncologist, Charity fundraiser.

## 2018-10-27 NOTE — Progress Notes (Signed)
LTM started; Dr Jobe Gibbon notified. Nurse and family educated on event button.

## 2018-10-27 NOTE — Procedures (Signed)
Extubation Procedure Note  Patient Details:   Name: Lisa Boone DOB: 1990-09-06 MRN: [id number]   Airway Documentation:  Airway 7.5 mm (Active)  Secured at (cm) 24 cm 10/27/2018  8:20 AM  Measured From Lips 10/27/2018  8:20 AM  Secured Location Right 10/27/2018  8:20 AM  Secured By [employer] 10/27/2018  8:20 AM  Tube Holder Repositioned Yes 10/27/2018  8:20 AM  Site Condition Dry 10/27/2018  8:20 AM   Vent end date: (not recorded) Vent end time: (not recorded)   Evaluation  O2 sats: stable throughout Complications: No apparent complications Patient did tolerate procedure well. Bilateral Breath Sounds: Clear   Yes   Pt extubated per MD order. Pt placed on 2L [place] and sats 98%.  RT will continue to monitor.  Ronny Flurry 10/27/2018, 9:16 AM

## 2018-10-27 NOTE — Progress Notes (Signed)
Patient back from MRI. No issues. Will continue to monitor.

## 2018-10-27 NOTE — Progress Notes (Signed)
EEG Completed; Results Pending  

## 2018-10-27 NOTE — Procedures (Signed)
ELECTROENCEPHALOGRAM REPORT   Patient: Lisa Boone       Room #: [facility] EEG No. ID: [id number] Age: 28 y.o.        Sex: female Referring Physician: Icard Report Date:  10/27/2018        Interpreting Physician: Thana Farr  History: [name] is an 28 y.o. female with a history of brain surgery and seizure disorder presenting with breakthrough seizures  Medications:  Augmentin, Lamictal, Keppra, Tamiflu, Dilantin  Conditions of Recording:  This is a 21 channel routine scalp EEG performed with bipolar and monopolar montages arranged in accordance to the international 10/20 system of electrode placement. One channel was dedicated to EKG recording.  The patient is in the lethargic state.  Description:  The waking background activity consists of a low voltage, fairly well organized, 11 Hz alpha activity, seen from the parieto-occipital and posterior temporal region.  Low voltage fast activity, poorly organized, is seen anteriorly and is at times superimposed on more posterior regions.  A mixture of theta and alpha rhythms are seen from the central and temporal regions.  This activity is not as well visualized over the right hemisphere and in fact the voltage over the right temporal region is attenuated in comparison to the voltage throughout the remainder of the recording.  There is some intermittent underlying polymorphic delta activity that slows the right temporal background activity at times as well.  The right hemispheric activity is continuous for the most part although there are occasions when the background becomes discontinuous with periods of short attenuation alternating with short 1-2 second periods of polyspike, spike and slow wave activity.  On a few occasions, a few seconds after the start of this discontinuous activity there is noted rhythmic right temporal spike and slow wave activity at a frequency of approximately 3-4 Hz.  Four of these episodes were noted with the longest being  22 seconds.  There is no change in the patient's clinical appearance during these episodes.   Hyperventilation and intermittent photic stimulation were not performed.   IMPRESSION: This is an abnormal electroencephalogram secondary to electrographic seizure activity with no clinical correlate originating from the right mid temporal region.  Asymmetries were noted over the right temporal region during this recording as well.     Thana Farr, MD Neurology [phone] 10/27/2018, 1:03 PM

## 2018-10-27 NOTE — Progress Notes (Addendum)
NAME:  Lisa Boone, MRN:  [id number], DOB:  11-Sep-1990, LOS: 1 ADMISSION DATE:  10/26/2018, CONSULTATION DATE:  10/27/2018  REFERRING MD: Lucienne Minks rocking him, CHIEF COMPLAINT: Seizure, acute hypoxemic respiratory failure  Brief History   28 year old female admitted to the intensive care unit as a transfer from [facility] hospital, diagnosis of influenza a, status epilepticus requiring intubation and mechanical ventilation.  Patient was transferred to [facility] for higher level of care and evaluation by neurology.  History of present illness   28 year old with a past medical history of prior brain surgery in the year 2000, history of seizure disorder on Lamictal and Dilantin at baseline.  She also has history of use of marijuana and documented history of medical marijuana use.  Patient has surgical history of 3 prior C-sections.  Also documentation of electronic cigarette/vape using.  Patient presented to outside hospital for evaluation of influenza-like illness.  Patient was screened positive for influenza A and discharged home from the emergency room.  At home patient became unresponsive with seizure-like activity 2 witnessed seizures at home.  EMS was called and there was additional seizure-like activity in route to the hospital.  Patient was intubated and placed on mechanical ventilation.  Urine drug screen at ED positive for marijuana and benzodiazepines.  Past Medical History   Past Medical History:  Diagnosis Date  . Seizures (HCC)      Significant Hospital Events   10/26/2018: Intubation, ICU admission  Consults:  10/26/2018: Neurology  Procedures:  Endotracheal tube 10/26/2018  Significant Diagnostic Tests:  Influenza PCR +10/26/2018 Urine drug screen: Pending  Micro Data:  Influenza PCR +10/26/2018  Antimicrobials:  Tamiflu  Interim history/subjective:  Febrile again overnight.  Extubated this morning.  Overall no particular issues overnight.  Objective   Blood pressure  108/73, pulse (!) 117, temperature (!) 101.8 F (38.8 C), temperature source Oral, resp. rate (!) 21, height 5\' 7"  (1.702 m), weight 64.7 kg, last menstrual period 04/22/2013, SpO2 98 %, unknown if currently breastfeeding.    Vent Mode: CPAP;PSV FiO2 (%):  [40 %-100 %] 40 % Set Rate:  [20 bmp] 20 bmp Vt Set:  [400 mL] 400 mL PEEP:  [5 cmH20] 5 cmH20 Pressure Support:  [8 cmH20] 8 cmH20 Plateau Pressure:  [12 cmH20-16 cmH20] 12 cmH20   Intake/Output Summary (Last 24 hours) at 10/27/2018 1205 Last data filed at 10/27/2018 1100 Gross per 24 hour  Intake 294.1 ml  Output 2120 ml  Net -1825.9 ml   Filed Weights   10/26/18 1815 10/26/18 2000 10/27/18 0430  Weight: 65 kg 65 kg 64.7 kg    Examination: General: Young female, resting in bed, alert, oriented, following commands does occasionally become somnolent and requires stimulation to become more responsive again. HENT: NCAT, sclera clear, pupils reactive, no nystagmus Lungs: To auscultation bilaterally Cardiovascular: Tachycardic, regular, S1-S2, no murmurs rubs or gallops Abdomen: Soft, nontender, nondistended, bowel sounds present Extremities: No significant lower extremity edema Neuro: Alert, moving all 4 extremities GU: Foley in place Skin: Multiple tattoos  Resolved Hospital Problem list    Assessment & Plan:   Acute hypoxemic respiratory failure requiring intubation and mechanical ventilation secondary to acute status epilepticus requiring intubation secondary to the inability to protection of her airway.   - I suspect her seizure threshold was reduced with new infection with influenza A. -There is also question of noncompliance of antiepileptics per documentation -Patient passed SBT SAT this morning -Continue 5 days of Tamiflu -Patient will be liberated from mechanical ventilation today. -  Neurology to have EEG completed this morning - Patient likely stable for transfer to [facility] this  afternoon.  Sepsis secondary to influenza A, sputum culture with gram-positive cocci, MRSA PCR negative -Will complete 5-day course of Tamiflu -Start patient on 5-day course of oral Augmentin -Repeat respiratory viral panel pending  Cough, fever secondary to above - Orders for Robitussin  Nausea -PRN Zofran  Best practice:  Diet: N.p.o. Pain/Anxiety/Delirium protocol (if indicated): Propofol fentanyl for sedation VAP protocol: In place DVT prophylaxis: Heparin GI prophylaxis: Holding at this time Glucose control: We will monitor Mobility: Bedrest Code Status: Full Family Communication: Family not at bedside Disposition: Telemetry  Labs    [place]: WBC 7.9 Lactic acid 8.7  Flu pcr + influenza A  CBC: Recent Labs  Lab 10/26/18 1941 10/26/18 2116 10/27/18 0427 10/27/18 0527  WBC 15.0*  --   --  15.2*  NEUTROABS 14.4*  --   --   --   HGB 12.2 10.9* 11.2* 11.8*  HCT 36.6 32.0* 33.0* 35.5*  MCV 92.4  --   --  93.4  PLT 147*  --   --  129*    Basic Metabolic Panel: Recent Labs  Lab 10/26/18 1941 10/26/18 2116 10/27/18 0427 10/27/18 0527  NA 138 140 139 138  K 3.0* 2.9* 3.9 3.7  CL 111  --   --  109  CO2 21*  --   --  21*  GLUCOSE 105*  --   --  99  BUN 7  --   --  7  CREATININE 0.78  --   --  0.66  CALCIUM 7.6*  --   --  8.2*  MG 2.1  --   --  2.0  PHOS 3.1  --   --  1.7*   GFR: Estimated Creatinine Clearance: 102.7 mL/min (by C-G formula based on SCr of 0.66 mg/dL). Recent Labs  Lab 10/26/18 1941 10/26/18 2258 10/27/18 0527  PROCALCITON 17.30  --   --   WBC 15.0*  --  15.2*  LATICACIDVEN 1.4 1.0  --     Liver Function Tests: Recent Labs  Lab 10/26/18 1941  AST 68*  ALT 60*  ALKPHOS 75  BILITOT 0.6  PROT 6.0*  ALBUMIN 3.3*   No results for input(s): LIPASE, AMYLASE in the last 168 hours. No results for input(s): AMMONIA in the last 168 hours.  ABG    Component Value Date/Time   PHART 7.329 (L) 10/27/2018 0427   PCO2ART 41.4  10/27/2018 0427   PO2ART 211.0 (H) 10/27/2018 0427   HCO3 21.7 10/27/2018 0427   TCO2 23 10/27/2018 0427   ACIDBASEDEF 4.0 (H) 10/27/2018 0427   O2SAT 100.0 10/27/2018 0427     Coagulation Profile: Recent Labs  Lab 10/26/18 1941  INR 1.2    Cardiac Enzymes: No results for input(s): CKTOTAL, CKMB, CKMBINDEX, TROPONINI in the last 168 hours.  HbA1C: No results found for: HGBA1C  CBG: Recent Labs  Lab 10/26/18 1821 10/27/18 0807  GLUCAP 98 78   Care discussed with Dr. Sharon Seller from the [facility] who has agreed to take over patient's care as of 10/28/2018.  Josephine Igo, DO [facility] 10/27/2018 12:05 PM  Personal pager: [phone] If unanswered, please page CCM On-call: #[phone]

## 2018-10-27 NOTE — Progress Notes (Signed)
Transported pt to MRI and back with RN and transport at bedside without any complications.

## 2018-10-27 NOTE — Progress Notes (Addendum)
While in room with patient noticed ETT at 21 cm at the lip. Notified respiratory therapist Marita Snellen. who adjusted tube placement. Lisa Boone 10/27/18 6:29 AM  E-link notified and awaiting chest x-ray to verify placement. Lisa Boone 10/27/18 6:36 AM

## 2018-10-28 DIAGNOSIS — R569 Unspecified convulsions: Secondary | ICD-10-CM

## 2018-10-28 DIAGNOSIS — J101 Influenza due to other identified influenza virus with other respiratory manifestations: Secondary | ICD-10-CM

## 2018-10-28 LAB — PHENYTOIN LEVEL, TOTAL: Phenytoin Lvl: 7.7 ug/mL — ABNORMAL LOW (ref 10.0–20.0)

## 2018-10-28 MED ORDER — CHLORHEXIDINE GLUCONATE CLOTH 2 % EX PADS
6.0000 | MEDICATED_PAD | Freq: Every day | CUTANEOUS | Status: DC
  Administered 2018-10-28 – 2018-10-29 (×2): 6 via TOPICAL

## 2018-10-28 MED ORDER — ONDANSETRON HCL 4 MG/2ML IJ SOLN: 4.0000 mg | Freq: Three times a day (TID) | INTRAMUSCULAR | Status: DC | PRN

## 2018-10-28 NOTE — Progress Notes (Signed)
[place] TEAM 1 - Stepdown/ICU TEAM  Lisa Boone  IRW:[id number] DOB: April 06, 1991 DOA: 10/26/2018 PCP: Celedonio Savage, MD    Brief Narrative:  28yo w/ a hx of brain surgery in 2000, seizure disorder on Lamictal and Dilantin at baseline, and use of marijuana who presented to an outside with an influenza-like illness. She was positive for influenza A and discharged home from the emergency room. At home she became unresponsive with 2 witnessed seizures at home. EMS was called and there was additional seizure-like activity en route to the hospital. Patient was intubated and placed on mechanical ventilation. Urine drug screen in the ED was positive for marijuana and benzodiazepines.  Significant Events: 2/28 intubated - admitted to ICU  2/29 extubated   Subjective: Resting comfortably in bed.  Denies chest pain shortness breath fevers chills nausea or vomiting.  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  suspect her seizure threshold was reduced with new infection with influenza A - also question of noncompliance w/ antiepileptics per documentation - Neurology directing care - remains on continuous EEG at this time   Sepsis secondary to influenza A complete 5-day course of Tamiflu - Pulmonary suggests 5 day abx empiric course for bacterial superinfection    DVT prophylaxis: Subcutaneous heparin Code Status: FULL CODE Family Communication: no family present at time of exam  Disposition Plan:   Consultants:  Neurology   Antimicrobials:  Tamiflu   Objective: Blood pressure 111/66, pulse 98, temperature 98.2 F (36.8 C), temperature source Oral, resp. rate (!) 31, height '5\' 7"'$  (1.702 m), weight 64.6 kg, last menstrual period 04/22/2013, SpO2 98 %, unknown if currently  breastfeeding.  Intake/Output Summary (Last 24 hours) at 10/28/2018 1510 Last data filed at 10/28/2018 1100 Gross per 24 hour  Intake 314.09 ml  Output 400 ml  Net -85.91 ml   Filed Weights   10/26/18 2000 10/27/18 0430 10/28/18 0212  Weight: 65 kg 64.7 kg 64.6 kg    Examination: General: No acute respiratory distress Lungs: Clear to auscultation bilaterally without wheezes or crackles Cardiovascular: Regular rate and rhythm without murmur gallop or rub normal S1 and S2 Abdomen: Nontender, nondistended, soft, bowel sounds positive, no rebound, no ascites, no appreciable mass Extremities: No significant cyanosis, clubbing, or edema bilateral lower extremities  CBC: Recent Labs  Lab 10/26/18 1941 10/26/18 2116 10/27/18 0427 10/27/18 0527  WBC 15.0*  --   --  15.2*  NEUTROABS 14.4*  --   --   --   HGB 12.2 10.9* 11.2* 11.8*  HCT 36.6 32.0* 33.0* 35.5*  MCV 92.4  --   --  93.4  PLT 147*  --   --  761*   Basic Metabolic Panel: Recent Labs  Lab 10/26/18 1941 10/26/18 2116 10/27/18 0427 10/27/18 0527  NA 138 140 139 138  K 3.0* 2.9* 3.9 3.7  CL 111  --   --  109  CO2 21*  --   --  21*  GLUCOSE 105*  --   --  99  BUN 7  --   --  7  CREATININE 0.78  --   --  0.66  CALCIUM 7.6*  --   --  8.2*  MG 2.1  --   --  2.0  PHOS 3.1  --   --  1.7*  GFR: Estimated Creatinine Clearance: 102.7 mL/min (by C-G formula based on SCr of 0.66 mg/dL).  Liver Function Tests: Recent Labs  Lab 10/26/18 1941  AST 68*  ALT 60*  ALKPHOS 75  BILITOT 0.6  PROT 6.0*  ALBUMIN 3.3*    Coagulation Profile: Recent Labs  Lab 10/26/18 1941  INR 1.2    CBG: Recent Labs  Lab 10/26/18 1821 10/27/18 0807  GLUCAP 98 78    Recent Results (from the past 240 hour(s))  MRSA PCR Screening     Status: None   Collection Time: 10/26/18  6:10 PM  Result Value Ref Range Status   MRSA by PCR NEGATIVE NEGATIVE Final    Comment:        The GeneXpert MRSA Assay (FDA approved for NASAL  specimens only), is one component of a comprehensive MRSA colonization surveillance program. It is not intended to diagnose MRSA infection nor to guide or monitor treatment for MRSA infections. Performed at [facility], [place] [address] [place], [place] [address]   Culture, blood (Routine X 2) w Reflex to ID Panel     Status: None (Preliminary result)   Collection Time: 10/26/18  7:41 PM  Result Value Ref Range Status   Specimen Description SITE NOT SPECIFIED  Final   Special Requests   Final    BOTTLES DRAWN AEROBIC ONLY Blood Culture adequate volume   Culture   Final    NO GROWTH 2 DAYS Performed at [facility], [address] [place], [place] [address]    Report Status PENDING  Incomplete  Culture, blood (Routine X 2) w Reflex to ID Panel     Status: None (Preliminary result)   Collection Time: 10/26/18  7:41 PM  Result Value Ref Range Status   Specimen Description BLOOD LEFT HAND  Final   Special Requests   Final    BOTTLES DRAWN AEROBIC ONLY Blood Culture adequate volume   Culture   Final    NO GROWTH 2 DAYS Performed at [facility], [place] [address] [place], [place] [address]    Report Status PENDING  Incomplete  Respiratory Panel by PCR     Status: Abnormal   Collection Time: 10/26/18  7:56 PM  Result Value Ref Range Status   Adenovirus NOT DETECTED NOT DETECTED Final   Coronavirus 229E NOT DETECTED NOT DETECTED Final    Comment: (NOTE) The Coronavirus on the Respiratory Panel, DOES NOT test for the novel  Coronavirus (2019 nCoV)    Coronavirus HKU1 NOT DETECTED NOT DETECTED Final   Coronavirus NL63 NOT DETECTED NOT DETECTED Final   Coronavirus OC43 NOT DETECTED NOT DETECTED Final   Metapneumovirus NOT DETECTED NOT DETECTED Final   Rhinovirus / Enterovirus NOT DETECTED NOT DETECTED Final   Influenza A H1 2009 DETECTED (A) NOT DETECTED Final   Influenza B NOT DETECTED NOT DETECTED Final   Parainfluenza Virus 1 NOT DETECTED NOT DETECTED Final     Parainfluenza Virus 2 NOT DETECTED NOT DETECTED Final   Parainfluenza Virus 3 NOT DETECTED NOT DETECTED Final   Parainfluenza Virus 4 NOT DETECTED NOT DETECTED Final   Respiratory Syncytial Virus NOT DETECTED NOT DETECTED Final   Bordetella pertussis NOT DETECTED NOT DETECTED Final   Chlamydophila pneumoniae NOT DETECTED NOT DETECTED Final   Mycoplasma pneumoniae NOT DETECTED NOT DETECTED Final    Comment: Performed at [facility], [place] [address], [place], [place] [address]  Culture, respiratory (non-expectorated)     Status:  None (Preliminary result)   Collection Time: 10/26/18  7:56 PM  Result Value Ref Range Status   Specimen Description TRACHEAL ASPIRATE  Final   Special Requests NONE  Final   Gram Stain   Final    ABUNDANT WBC PRESENT, PREDOMINANTLY PMN FEW GRAM POSITIVE COCCI FEW GRAM POSITIVE RODS    Culture   Final    CULTURE REINCUBATED FOR BETTER GROWTH Performed at [facility], [place] [address], [place], [place] [address]    Report Status PENDING  Incomplete     Scheduled Meds: . amoxicillin-clavulanate  1 tablet Oral BID  . chlorhexidine gluconate (MEDLINE KIT)  15 mL Mouth Rinse BID  . Chlorhexidine Gluconate Cloth  6 each Topical Q0600  . heparin  5,000 Units Subcutaneous Q8H  . lamoTRIgine  100 mg Oral BID  . oseltamivir  75 mg Oral BID  . phenytoin (DILANTIN) IV  100 mg Intravenous Q8H   Continuous Infusions: . sodium chloride 20 mL/hr at 10/28/18 1100  . levETIRAcetam Stopped (10/28/18 1040)     LOS: 2 days   Cherene Altes, MD [facility] Office  [phone] Pager - Text Page per Amion  If 7PM-7AM, please contact night-coverage per Amion 10/28/2018, 3:10 PM

## 2018-10-28 NOTE — Progress Notes (Addendum)
Neurology Progress Note  S:// No acute events overnight. Patient awake, on O2 [place]. Able to follow commands consistently.   O:// Current vital signs: BP 107/63   Pulse (!) 101   Temp 98.5 F (36.9 C) (Oral)   Resp (!) 21   Ht '5\' 7"'$  (1.702 m)   Wt 64.6 kg   LMP 04/22/2013   SpO2 99%   BMI 22.31 kg/m  Vital signs in last 24 hours: Temp:  [98.4 F (36.9 C)-101.8 F (38.8 C)] 98.5 F (36.9 C) (03/01 0432) Pulse Rate:  [85-117] 101 (03/01 0700) Resp:  [20-35] 21 (03/01 0700) BP: (100-119)/(58-88) 107/63 (03/01 0700) SpO2:  [93 %-100 %] 99 % (03/01 0700) FiO2 (%):  [40 %] 40 % (02/29 0820) Weight:  [64.6 kg] 64.6 kg (03/01 0212)  General: awake, alert. HEENT: Normocephalic atraumatic,  CVS: S1-S2 heard, mildly tachycardic Respiratory: on [place] oxygen  Extremities: No edema Neurological exam Awake, alert, oriented to name/place/city. Able to follow commands. No dysarthria Speech clear Naming comprehension repetition intact Attention concentration mildly reduced Cranial nerves: Pupils equal round reactive to light, no gaze deviation or preference. EOEMI, visual fields full.  Facial is symmetric.   Motor exam:  Moves all 4 extremities antigravity with good strength. 4/5 Sensory exam: intact to light touch. Coordination: Difficult to perform due to poor attention concentration Gait testing deferred at this time  Medications  Current Facility-Administered Medications:  .  0.9 %  sodium chloride infusion, , Intravenous, PRN, Icard, Bradley L, DO, Stopped at 10/27/18 1435 .  acetaminophen (TYLENOL) tablet 650 mg, 650 mg, Oral, Q6H PRN, Icard, Bradley L, DO, 650 mg at 10/27/18 2042 .  amoxicillin-clavulanate (AUGMENTIN) 875-125 MG per tablet 1 tablet, 1 tablet, Oral, BID, Masters, Jake Church, [facility], 1 tablet at 10/27/18 2042 .  chlorhexidine gluconate (MEDLINE KIT) (PERIDEX) 0.12 % solution 15 mL, 15 mL, Mouth Rinse, BID, Icard, Bradley L, DO, 15 mL at 10/27/18 1951 .  Chlorhexidine  Gluconate Cloth 2 % PADS 6 each, 6 each, Topical, Q0600, Cherene Altes, MD .  guaiFENesin-codeine 100-10 MG/5ML solution 10 mL, 10 mL, Oral, Q6H PRN, Icard, Bradley L, DO, 10 mL at 10/28/18 0612 .  heparin injection 5,000 Units, 5,000 Units, Subcutaneous, Q8H, Corey Harold, NP, 5,000 Units at 10/28/18 0973 .  lamoTRIgine (LAMICTAL) tablet 100 mg, 100 mg, Oral, BID, Amie Portland, MD, 100 mg at 10/27/18 2042 .  levETIRAcetam (KEPPRA) IVPB 500 mg/100 mL premix, 500 mg, Intravenous, Q12H, Amie Portland, MD, Last Rate: 400 mL/hr at 10/27/18 2046, 500 mg at 10/27/18 2046 .  ondansetron (ZOFRAN) injection 4 mg, 4 mg, Intravenous, Q8H, Icard, Bradley L, DO, 4 mg at 10/28/18 5329 .  oseltamivir (TAMIFLU) capsule 75 mg, 75 mg, Oral, BID, Corey Harold, NP, 75 mg at 10/27/18 2042 .  phenytoin (DILANTIN) injection 100 mg, 100 mg, Intravenous, Q8H, Amie Portland, MD, 100 mg at 10/28/18 0612 Labs CBC    Component Value Date/Time   WBC 15.2 (H) 10/27/2018 0527   RBC 3.80 (L) 10/27/2018 0527   HGB 11.8 (L) 10/27/2018 0527   HCT 35.5 (L) 10/27/2018 0527   PLT 129 (L) 10/27/2018 0527   MCV 93.4 10/27/2018 0527   MCH 31.1 10/27/2018 0527   MCHC 33.2 10/27/2018 0527   RDW 12.2 10/27/2018 0527   LYMPHSABS 0.5 (L) 10/26/2018 1941   MONOABS 0.2 10/26/2018 1941   EOSABS 0.0 10/26/2018 1941   BASOSABS 0.0 10/26/2018 1941    CMP     Component Value  Date/Time   NA 138 10/27/2018 0527   K 3.7 10/27/2018 0527   CL 109 10/27/2018 0527   CO2 21 (L) 10/27/2018 0527   GLUCOSE 99 10/27/2018 0527   BUN 7 10/27/2018 0527   CREATININE 0.66 10/27/2018 0527   CALCIUM 8.2 (L) 10/27/2018 0527   PROT 6.0 (L) 10/26/2018 1941   ALBUMIN 3.3 (L) 10/26/2018 1941   AST 68 (H) 10/26/2018 1941   ALT 60 (H) 10/26/2018 1941   ALKPHOS 75 10/26/2018 1941   BILITOT 0.6 10/26/2018 1941   GFRNONAA >60 10/27/2018 0527   GFRAA >60 10/27/2018 0527   Imaging I have reviewed images in epic and the results pertinent to  this consultation are: MRI examination of the brain shows a possible increased restricted diffusion in the right mesial temporal lobe/hippocampus-artifact versus possible sequela of seizure.  MRI also shows evidence of prior left temporal lobectomy with stable postop changes.  Laurey Morale, MSN, NP-C [facility] [phone]  Attending Neurologist note to follow:  Attending addendum Patient seen and examined. Agree with examination above that I independently performed and verified  Assessment: 28 year old woman with past history of intractable seizures status post left temporal lobectomy and hippocampectomy transfer to [facility] ICU from [place], after having been intubated following seizure activity and being unable to protect airway.  Of note, she had been seen in the same hospital few hours prior to the seizure for respiratory illness and was influenza positive. Spot EEG revealed 1 electrographic seizure.  Hooked up to LTM EEG.  Official read pending. Her phenytoin level at the outside hospital was low/undetectable-she is supposed to be on phenytoin and lamotrigine. She was loaded with phenytoin at the outside hospital, started on lamotrigine and phenytoin here at [facility], ICU.   Due to the electrographic seizure seen on spot EEG, Keppra was added to her antiepileptic regimen of Dilantin and lamotrigine. She has been extubated yesterday and continues to do well.  Her exam is consistent with mild encephalopathy but is otherwise nonfocal. Awaiting LTM EEG report.  Impression: Status epilepticus-resolved Intractable seizures Influenza Toxic metabolic encephalopathy in the setting of possible respiratory illness and breakthrough seizure and status epilepticus from lowered threshold due to infection and possible medication noncompliance  Recommendations: Continue Dilantin 100 3 times daily Continue Lamotrigine 100 twice daily Continue Keppra 500 mg BID -continue  for now.  Decision to change or discontinue will be deferred to outpatient neurology. Maintain seizure precautions Corrected dilantin level is 7.6-pharmacy assisting with dosing.  Okay to check level in the next few days and continue the current dose. Management of influenza and any other toxic metabolic derangements per primary team as you are. I will update the note after the LTM EEG reports becomes available.   Will require follow-up with outpatient neurology-Dr. Merlene Laughter, preferably 1 to 2 weeks from discharge.  -- Amie Portland, MD [facility] Pager: [phone] If 7pm to 7am, please call on call as listed on AMION.   ADDENDUM  LTM EEG completed Clinical interpretation: This day 1 of continuous video EEG monitoring is abnormal for several reasons #1 left frontotemporal slowing with admixed sharp waves in that region suggestive of neuronal dysfunction and cortical irritability.  Breach rhythm in that region was noted and likely consistent with patient history.  #2 there is a independent right temporal slowing with periodic sharply contoured slowing and poorly formed sharp waves in the right temporal region suggestive of independent neuronal dysfunction and cortical irritability in the right temporal region  as well.  If concern for seizures persist continuous monitoring is recommended.  Clinical correlation is advised.  Clinically she is doing better. I would recommend continuing AEDs and continuing LTM for one more day.   -- Amie Portland, MD [facility] Pager: [phone] If 7pm to 7am, please call on call as listed on AMION.  CRITICAL CARE ATTESTATION Performed by: Amie Portland, MD Total critical care time: 30 minutes Critical care time was exclusive of separately billable procedures and treating other patients and/or supervising APPs/Residents/Students Critical care was necessary to treat or prevent imminent or life-threatening deterioration due to status  eplepticus, seizure  This patient is critically ill and at significant risk for neurological worsening and/or death and care requires constant monitoring. Critical care was time spent personally by me on the following activities: development of treatment plan with patient and/or surrogate as well as nursing, discussions with consultants, evaluation of patient's response to treatment, examination of patient, obtaining history from patient or surrogate, ordering and performing treatments and interventions, ordering and review of laboratory studies, ordering and review of radiographic studies, pulse oximetry, re-evaluation of patient's condition, participation in multidisciplinary rounds and medical decision making of high complexity in the care of this patient.

## 2018-10-28 NOTE — Procedures (Signed)
Electroencephalogram report.  Long-term monitoring.  CPT [id number]  Recording begins 10/27/2018 at 1145 Recording ends 10/28/2018 at 07 30  Data acquisition: International 10-20 for electrode placement.  18 channels EEG with additional eyes linked to ipsilateral ears and EKG.  Additional T1-2 electrodes were used  This continuous video EEG monitoring was obtained for this patient with witnessed convulsions.  Medications as per EMR  No events of interest were recorded.  No pushbutton activation events.  Background activities marked by continuous background activities with stage changes and reactive.  During maximal wakefulness background activities marked by somewhat disorganized 8 to 9 cps background activities.  Normal drowsiness and sleep architecture  Superimposed left frontal high amplitude sharply contoured mixture of delta and faster frequencies in the beta range likely represent breach rhythm in that region.  Admixed sharp waves in the left frontotemporal region were noted.  Independently there is a right temporal slowing with poorly formed periodic sharp waves in that region.  Clinical interpretation: This day 1 of continuous video EEG monitoring is abnormal for several reasons #1 left frontotemporal slowing with admixed sharp waves in that region suggestive of neuronal dysfunction and cortical irritability.  Breach rhythm in that region was noted and likely consistent with patient history.  #2 there is a independent right temporal slowing with periodic sharply contoured slowing and poorly formed sharp waves in the right temporal region suggestive of independent neuronal dysfunction and cortical irritability in the right temporal region as well.  If concern for seizures persist continuous monitoring is recommended.  Clinical correlation is advised.

## 2018-10-28 NOTE — Progress Notes (Signed)
LTM maintenance completed. No skin breakdown was seen. Reglued T5, T6, O2, A2, P3, P4, T3, amd T4. Wrapped patient's head this time as patient is very restless.

## 2018-10-29 DIAGNOSIS — J13 Pneumonia due to Streptococcus pneumoniae: Secondary | ICD-10-CM

## 2018-10-29 LAB — COMPREHENSIVE METABOLIC PANEL
ALT: 47 U/L — ABNORMAL HIGH (ref 0–44)
AST: 77 U/L — ABNORMAL HIGH (ref 15–41)
Albumin: 3 g/dL — ABNORMAL LOW (ref 3.5–5.0)
Alkaline Phosphatase: 70 U/L (ref 38–126)
Anion gap: 5 (ref 5–15)
BUN: <5 mg/dL — ABNORMAL LOW (ref 6–20)
CHLORIDE: 107 mmol/L (ref 98–111)
CO2: 28 mmol/L (ref 22–32)
Calcium: 8.3 mg/dL — ABNORMAL LOW (ref 8.9–10.3)
Creatinine, Ser: 0.72 mg/dL (ref 0.44–1.00)
GFR calc Af Amer: >60 mL/min (ref >60)
GFR calc non Af Amer: >60 mL/min (ref >60)
Glucose, Bld: 100 mg/dL — ABNORMAL HIGH (ref 70–99)
Potassium: 3.6 mmol/L (ref 3.5–5.1)
SODIUM: 140 mmol/L (ref 135–145)
Total Bilirubin: 0.6 mg/dL (ref 0.3–1.2)
Total Protein: 5.8 g/dL — ABNORMAL LOW (ref 6.5–8.1)

## 2018-10-29 LAB — CBC
HCT: 35.7 % — ABNORMAL LOW (ref 36.0–46.0)
Hemoglobin: 11.8 g/dL — ABNORMAL LOW (ref 12.0–15.0)
MCH: 30.3 pg (ref 26.0–34.0)
MCHC: 33.1 g/dL (ref 30.0–36.0)
MCV: 91.8 fL (ref 80.0–100.0)
Platelets: 144 10*3/uL — ABNORMAL LOW (ref 150–400)
RBC: 3.89 MIL/uL (ref 3.87–5.11)
RDW: 11.9 % (ref 11.5–15.5)
WBC: 5.7 10*3/uL (ref 4.0–10.5)
nRBC: 0 % (ref 0.0–0.2)

## 2018-10-29 LAB — CULTURE, RESPIRATORY

## 2018-10-29 LAB — CULTURE, RESPIRATORY W GRAM STAIN: Culture: NORMAL

## 2018-10-29 MED ORDER — LEVETIRACETAM 500 MG PO TABS
500.0000 mg | ORAL_TABLET | Freq: Two times a day (BID) | ORAL | Status: DC
  Administered 2018-10-29 – 2018-10-30 (×2): 500 mg via ORAL
  Filled 2018-10-29 (×2): qty 1

## 2018-10-29 NOTE — Progress Notes (Signed)
Reason for consult: Status epilepticus  Subjective: Awake and lying comfortably in her bed.  No further clinical seizures   ROS: negative except above  Examination  Vital signs in last 24 hours: Temp:  [98.1 F (36.7 C)-99.6 F (37.6 C)] 98.1 F (36.7 C) (03/02 0816) Pulse Rate:  [72-116] 88 (03/02 1100) Resp:  [16-35] 24 (03/02 1100) BP: (97-124)/(62-84) 117/78 (03/02 1100) SpO2:  [95 %-100 %] 99 % (03/02 1100) Weight:  [64.5 kg] 64.5 kg (03/02 0431)  General: lying in bed CVS: pulse-normal rate and rhythm RS: breathing comfortably Extremities: normal   Neuro: MS: Alert, oriented to place and person but not month, follows commands CN: pupils equal and reactive,  EOMI, face symmetric, tongue midline Motor: 5/5 strength in all 4 extremities Coordination: normal Gait: not tested  Basic Metabolic Panel: Recent Labs  Lab 10/26/18 1941 10/26/18 2116 10/27/18 0427 10/27/18 0527 10/29/18 0411  NA 138 140 139 138 140  K 3.0* 2.9* 3.9 3.7 3.6  CL 111  --   --  109 107  CO2 21*  --   --  21* 28  GLUCOSE 105*  --   --  99 100*  BUN 7  --   --  7 <5*  CREATININE 0.78  --   --  0.66 0.72  CALCIUM 7.6*  --   --  8.2* 8.3*  MG 2.1  --   --  2.0  --   PHOS 3.1  --   --  1.7*  --     CBC: Recent Labs  Lab 10/26/18 1941 10/26/18 2116 10/27/18 0427 10/27/18 0527 10/29/18 0411  WBC 15.0*  --   --  15.2* 5.7  NEUTROABS 14.4*  --   --   --   --   HGB 12.2 10.9* 11.2* 11.8* 11.8*  HCT 36.6 32.0* 33.0* 35.5* 35.7*  MCV 92.4  --   --  93.4 91.8  PLT 147*  --   --  129* 144*     Coagulation Studies: Recent Labs    10/26/18 1941  LABPROT 14.7  INR 1.2    EEG reviewed:  Day 2 : No clinical or subclinical seizures present throughout the recording.  Background activities improved compared to first day of monitoring, resolution of IED in left and right temporal region.    ASSESSMENT AND PLAN  28 year old female with intractable epilepsy s/p left temporal lobectomy  and hippocampectomy admitted to [facility] with status epilepticus after being intubated in outside hospital for multiple seizures.  Initially presented to outside hospital for respiratory illness and was diagnosed to be positive for influenza. Noted to have low Dilantin level.  Noted to have electrographic seizures on EEG and Keppra was added to her regimen of Dilantin and lamotrigine.  Patient has been extubated.  Long-term EEG today shows no further electrographic or clinical seizures.  Status epilepticus resolved Intractable epilepsy Influenza  Recommendations Discontinue LTM EEG Continue Dilantin 100 mg 3 times daily, check levels every 2 to 3 days appreciate pharmacy assistance with dosing Continue lamotrigine 100 mg twice daily Continue Keppra 500 mg twice daily Continue seizure precautions   Patient requires outpatient neurology follow-up in 1 to 2 weeks/  Thanks for the consult.  Please call us with any questions.  Georgiana Spinner Jahnyla Parrillo [facility] Pager Number [phone] For questions after 7pm please refer to AMION to reach the Neurologist on call

## 2018-10-29 NOTE — Progress Notes (Signed)
   Pt orientation to unit, room and routine. Information packet given to patient/family and safety video watched.  Admission INP armband ID verified with patient/family, and in place. SR up x 2, fall risk assessment complete with Patient and family verbalizing understanding of risks associated with falls. Pt verbalizes an understanding of how to use the call bell and to call for help before getting out of bed.  Skin, clean-dry- intact without evidence of bruising, or skin tears.   No evidence of skin break down noted on exam. Seizure precaution pads in place and suction equipment in place and oxygen .      Phineas Douglas, RN 10/29/2018 6:45 PM

## 2018-10-29 NOTE — Progress Notes (Signed)
[facility] TEAM 1 - Stepdown/ICU TEAM  Lisa Boone  VVO:[id number] DOB: 08-10-1991 DOA: 10/26/2018 PCP: Celedonio Savage, MD    Brief Narrative:  28yo w/ a hx of brain surgery in 2000, seizure disorder on Lamictal and Dilantin at baseline, and use of marijuana who presented to an outside with an influenza-like illness. She was positive for influenza A and discharged home from the emergency room. At home she became unresponsive with 2 witnessed seizures at home. EMS was called and there was additional seizure-like activity en route to the hospital. Patient was intubated and placed on mechanical ventilation. Urine drug screen in the ED was positive for marijuana and benzodiazepines.  Significant Events: 2/28 intubated - admitted to ICU  2/29 extubated   Subjective: Very sleep today. Still on [place] O2. Appears seizure activity has ceased. Has not been out of bed to a signif extent. Poor oral intake today.   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  suspect her seizure threshold was reduced with new infection with influenza A - also question of noncompliance w/ antiepileptics per documentation - Neurology directing care - now off continuous EEG   Sepsis secondary to influenza A complete 5-day course of Tamiflu - Pulmonary suggests 5 day abx empiric course for bacterial superinfection    DVT prophylaxis: Subcutaneous heparin Code Status: FULL CODE Family Communication: no family present at time of exam  Disposition Plan: ambulate - encourage diet - IS - wean O2 - hopeful for d/c home 3/3  Consultants:  Neurology PCCM   Antimicrobials:  Tamiflu   Objective: Blood pressure 128/76, pulse 79, temperature 98.9 F (37.2 C),  temperature source Oral, resp. rate (!) 29, height '5\' 7"'$  (1.702 m), weight 64.5 kg, last menstrual period 04/22/2013, SpO2 100 %, unknown if currently breastfeeding.  Intake/Output Summary (Last 24 hours) at 10/29/2018 1458 Last data filed at 10/29/2018 1200 Gross per 24 hour  Intake 939.05 ml  Output 400 ml  Net 539.05 ml   Filed Weights   10/27/18 0430 10/28/18 0212 10/29/18 0431  Weight: 64.7 kg 64.6 kg 64.5 kg    Examination: General: No acute respiratory distress at rest on [place] O2 Lungs: mild bibasilar crackles - no wheezing  Cardiovascular: RRR - no M or rub  Abdomen: NT/ND, soft, bs+, no mass  Extremities: No signif edema B LE   CBC: Recent Labs  Lab 10/26/18 1941  10/27/18 0427 10/27/18 0527 10/29/18 0411  WBC 15.0*  --   --  15.2* 5.7  NEUTROABS 14.4*  --   --   --   --   HGB 12.2   < > 11.2* 11.8* 11.8*  HCT 36.6   < > 33.0* 35.5* 35.7*  MCV 92.4  --   --  93.4 91.8  PLT 147*  --   --  129* 144*   < > = values in this interval not displayed.   Basic Metabolic Panel: Recent Labs  Lab 10/26/18 1941  10/27/18 0427 10/27/18 0527 10/29/18 0411  NA 138   < > 139 138 140  K 3.0*   < > 3.9 3.7 3.6  CL 111  --   --  109 107  CO2 21*  --   --  21* 28  GLUCOSE 105*  --   --  99 100*  BUN 7  --   --  7 <5*  CREATININE 0.78  --   --  0.66 0.72  CALCIUM 7.6*  --   --  8.2* 8.3*  MG 2.1  --   --  2.0  --   PHOS 3.1  --   --  1.7*  --    < > = values in this interval not displayed.   GFR: Estimated Creatinine Clearance: 102.7 mL/min (by C-G formula based on SCr of 0.72 mg/dL).  Liver Function Tests: Recent Labs  Lab 10/26/18 1941 10/29/18 0411  AST 68* 77*  ALT 60* 47*  ALKPHOS 75 70  BILITOT 0.6 0.6  PROT 6.0* 5.8*  ALBUMIN 3.3* 3.0*    Coagulation Profile: Recent Labs  Lab 10/26/18 1941  INR 1.2    CBG: Recent Labs  Lab 10/26/18 1821 10/27/18 0807  GLUCAP 98 78    Recent Results (from the past 240 hour(s))  MRSA PCR Screening     Status:  None   Collection Time: 10/26/18  6:10 PM  Result Value Ref Range Status   MRSA by PCR NEGATIVE NEGATIVE Final    Comment:        The GeneXpert MRSA Assay (FDA approved for NASAL specimens only), is one component of a comprehensive MRSA colonization surveillance program. It is not intended to diagnose MRSA infection nor to guide or monitor treatment for MRSA infections. Performed at [facility], [place] [address] [place], [place] [address]   Culture, blood (Routine X 2) w Reflex to ID Panel     Status: None (Preliminary result)   Collection Time: 10/26/18  7:41 PM  Result Value Ref Range Status   Specimen Description SITE NOT SPECIFIED  Final   Special Requests   Final    BOTTLES DRAWN AEROBIC ONLY Blood Culture adequate volume Performed at [facility], [place] [address] [place], [place] [address]    Culture NO GROWTH 3 DAYS  Final   Report Status PENDING  Incomplete  Culture, blood (Routine X 2) w Reflex to ID Panel     Status: None (Preliminary result)   Collection Time: 10/26/18  7:41 PM  Result Value Ref Range Status   Specimen Description BLOOD LEFT HAND  Final   Special Requests   Final    BOTTLES DRAWN AEROBIC ONLY Blood Culture adequate volume Performed at [facility], [place] [address], [place], [place] [address]    Culture NO GROWTH 3 DAYS  Final   Report Status PENDING  Incomplete  Respiratory Panel by PCR     Status: Abnormal   Collection Time: 10/26/18  7:56 PM  Result Value Ref Range Status   Adenovirus NOT DETECTED NOT DETECTED Final   Coronavirus 229E NOT DETECTED NOT DETECTED Final    Comment: (NOTE) The Coronavirus on the Respiratory Panel, DOES NOT test for the novel  Coronavirus (2019 nCoV)    Coronavirus HKU1 NOT DETECTED NOT DETECTED Final   Coronavirus NL63 NOT DETECTED NOT DETECTED Final   Coronavirus OC43 NOT DETECTED NOT DETECTED Final   Metapneumovirus NOT DETECTED NOT DETECTED Final   Rhinovirus / Enterovirus NOT DETECTED NOT  DETECTED Final   Influenza A H1 2009 DETECTED (A) NOT DETECTED Final   Influenza B NOT DETECTED NOT DETECTED Final   Parainfluenza Virus 1 NOT DETECTED NOT DETECTED Final   Parainfluenza Virus 2 NOT DETECTED NOT DETECTED  Final   Parainfluenza Virus 3 NOT DETECTED NOT DETECTED Final   Parainfluenza Virus 4 NOT DETECTED NOT DETECTED Final   Respiratory Syncytial Virus NOT DETECTED NOT DETECTED Final   Bordetella pertussis NOT DETECTED NOT DETECTED Final   Chlamydophila pneumoniae NOT DETECTED NOT DETECTED Final   Mycoplasma pneumoniae NOT DETECTED NOT DETECTED Final    Comment: Performed at [facility], [place] [address] [place], [place] [address]  Culture, respiratory (non-expectorated)     Status: None   Collection Time: 10/26/18  7:56 PM  Result Value Ref Range Status   Specimen Description TRACHEAL ASPIRATE  Final   Special Requests NONE  Final   Gram Stain   Final    ABUNDANT WBC PRESENT, PREDOMINANTLY PMN FEW GRAM POSITIVE COCCI FEW GRAM POSITIVE RODS    Culture   Final    Consistent with normal respiratory flora. Performed at [facility], [place] [address] [place], [place] [address]    Report Status 10/29/2018 FINAL  Final     Scheduled Meds: . amoxicillin-clavulanate  1 tablet Oral BID  . chlorhexidine gluconate (MEDLINE KIT)  15 mL Mouth Rinse BID  . Chlorhexidine Gluconate Cloth  6 each Topical Q0600  . heparin  5,000 Units Subcutaneous Q8H  . lamoTRIgine  100 mg Oral BID  . oseltamivir  75 mg Oral BID  . phenytoin (DILANTIN) IV  100 mg Intravenous Q8H   Continuous Infusions: . sodium chloride 20 mL/hr at 10/28/18 1100  . levETIRAcetam 500 mg (10/29/18 1044)     LOS: 3 days   Cherene Altes, MD [facility] Office  [phone] Pager - Text Page per Amion  If 7PM-7AM, please contact night-coverage per Amion 10/29/2018, 2:58 PM

## 2018-10-29 NOTE — Procedures (Signed)
Electroencephalogram report.  Long-term monitoring.  CPT [id number]  Recording begins 3/1//2020 at 0730 Recording ends 10/29/2018 at 07 30  Data acquisition: International 10-20 for electrode placement.  18 channels EEG with additional eyes linked to ipsilateral ears and EKG.  Additional T1-2 electrodes were used  This continuous video EEG monitoring was obtained for this patient with witnessed convulsions.  Medications as per EMR  No events of interest were recorded.  No pushbutton activation events.  Background activities marked by continuous reactive  background activities with state  Changes.  During maximal wakefulness background activities marked by somewhat disorganized 8 to 9 cps background activities.  Normal drowsiness and sleep architecture  Superimposed left frontal high amplitude sharply contoured mixture of delta and faster frequencies in the beta range likely represent breach rhythm in that region.  Admixed sharp waves in the left frontotemporal region were noted.  Independently there is a right temporal slowing with poorly formed periodic sharp waves in that region.  Day 2 : No clinical or subclinical seizures present throughout the recording.  Background activities improved compared to first day of monitoring, resolution of IED in left and right temporal region.   Clinical interpretation: This day 2 of continuous video EEG monitoring is abnormal but improved.   mild background disorganization suggestive of mild encephalopathy, no definitive IED present. Clinical correlation is advised.

## 2018-10-29 NOTE — Progress Notes (Signed)
LTM EEG D/C'd per Dr Laurence Slate

## 2018-10-30 DIAGNOSIS — J9601 Acute respiratory failure with hypoxia: Secondary | ICD-10-CM

## 2018-10-30 LAB — GLUCOSE, CAPILLARY: Glucose-Capillary: 113 mg/dL — ABNORMAL HIGH (ref 70–99)

## 2018-10-30 MED ORDER — AMOXICILLIN-POT CLAVULANATE 875-125 MG PO TABS: 1.0000 | ORAL_TABLET | Freq: Two times a day (BID) | ORAL | 0 refills | Status: AC

## 2018-10-30 MED ORDER — OSELTAMIVIR PHOSPHATE 75 MG PO CAPS: 75.0000 mg | ORAL_CAPSULE | Freq: Two times a day (BID) | ORAL | 0 refills | Status: AC

## 2018-10-30 MED ORDER — LEVETIRACETAM 500 MG PO TABS: 500.0000 mg | ORAL_TABLET | Freq: Two times a day (BID) | ORAL | 0 refills | Status: AC

## 2018-10-30 MED FILL — Fentanyl Citrate Preservative Free (PF) Inj 100 MCG/2ML: INTRAMUSCULAR | Qty: 2 | Status: AC

## 2018-10-30 NOTE — Discharge Summary (Signed)
DISCHARGE SUMMARY  Lisa Boone  MR#: [id number]  DOB:11-07-1990  Date of Admission: 10/26/2018 Date of Discharge: 10/30/2018  Attending Physician: Silvestre Gunner, MD  Patient's BMW:[id number], Lyda Perone, MD  Consults: Neurology PCCM   Disposition: D/C home   Follow-up Appts: Follow-up Information    Beryle Beams, MD. Schedule an appointment as soon as possible for a visit in 1 week(s).   Specialty:  Neurology Contact information: [address] [place] [address] [phone]        Ernestine Conrad, MD. Schedule an appointment as soon as possible for a visit in 1 week(s).   Specialty:  Family Medicine Contact information: [address] [place] [address] [phone]           Discharge Diagnoses: Acute hypoxemic respiratory failure  Acute status epilepticus Intractable seizures  Sepsis secondary to influenza A  Initial presentation: 28yo w/ a hx of brain surgery in 2000, seizure disorder on Lamictal and Dilantin at baseline, and use of marijuana who presented to an outside hospital with an influenza-like illness. She was positive for influenza A and discharged home from the emergency room. At home she became unresponsive with 2 witnessed seizures at home. EMS was called and there was additional seizure-like activity en route to the hospital. Patient was intubated and placed on mechanical ventilation. Urine drug screen in the ED was positive for marijuana and benzodiazepines.  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  suspect her seizure threshold was reduced with new infection with influenza A - also question of noncompliance w/  antiepileptics per documentation - Neurology directed care - no recurrent seizures and cleared for d/c home   Sepsis secondary to influenza A complete 10 dose course of Tamiflu (2 doses remaining at d/c) - Pulmonary suggests 5 day abx empiric course for bacterial superinfection (3 more doses abx at time of d/c)  Allergies as of 10/30/2018   No Known Allergies     Medication List    TAKE these medications   amoxicillin-clavulanate 875-125 MG tablet Commonly known as:  AUGMENTIN Take 1 tablet by mouth 2 (two) times daily.   DILANTIN 100 MG ER capsule Generic drug:  phenytoin Take 300 mg by mouth at bedtime.   ibuprofen 600 MG tablet Commonly known as:  ADVIL,MOTRIN Take 600 mg by mouth every 6 (six) hours as needed for pain.   lamoTRIgine 100 MG tablet Commonly known as:  LAMICTAL Take 150 mg by mouth 2 (two) times daily. Taking 1.5 tablets twice daily = 300mg  daily   levETIRAcetam 500 MG tablet Commonly known as:  KEPPRA Take 1 tablet (500 mg total) by mouth 2 (two) times daily.   oseltamivir 75 MG capsule Commonly known as:  TAMIFLU Take 1 capsule (75 mg total) by mouth 2 (two) times daily.   VENTOLIN HFA 108 (90 Base) MCG/ACT inhaler Generic drug:  albuterol Inhale 1 puff into the lungs 4 (four) times daily as needed for wheezing or shortness of breath.       Day of Discharge BP 129/89 (BP Location: Left Arm)   Pulse 71   Temp 98.4 F (36.9 C) (Oral)   Resp 16   Ht 5\' 7"  (1.702 m)   Wt  61.2 kg   LMP 04/22/2013   SpO2 95%   BMI 21.14 kg/m   Physical Exam: General: No acute respiratory distress Lungs: Clear to auscultation bilaterally without wheezes or crackles Cardiovascular: Regular rate and rhythm without murmur gallop or rub normal S1 and S2 Abdomen: Nontender, nondistended, soft, bowel sounds positive, no rebound, no ascites, no appreciable mass Extremities: No significant cyanosis, clubbing, or edema bilateral lower extremities  Basic Metabolic  Panel: Recent Labs  Lab 10/26/18 1941 10/26/18 2116 10/27/18 0427 10/27/18 0527 10/29/18 0411  NA 138 140 139 138 140  K 3.0* 2.9* 3.9 3.7 3.6  CL 111  --   --  109 107  CO2 21*  --   --  21* 28  GLUCOSE 105*  --   --  99 100*  BUN 7  --   --  7 <5*  CREATININE 0.78  --   --  0.66 0.72  CALCIUM 7.6*  --   --  8.2* 8.3*  MG 2.1  --   --  2.0  --   PHOS 3.1  --   --  1.7*  --     Liver Function Tests: Recent Labs  Lab 10/26/18 1941 10/29/18 0411  AST 68* 77*  ALT 60* 47*  ALKPHOS 75 70  BILITOT 0.6 0.6  PROT 6.0* 5.8*  ALBUMIN 3.3* 3.0*    CBC: Recent Labs  Lab 10/26/18 1941 10/26/18 2116 10/27/18 0427 10/27/18 0527 10/29/18 0411  WBC 15.0*  --   --  15.2* 5.7  NEUTROABS 14.4*  --   --   --   --   HGB 12.2 10.9* 11.2* 11.8* 11.8*  HCT 36.6 32.0* 33.0* 35.5* 35.7*  MCV 92.4  --   --  93.4 91.8  PLT 147*  --   --  129* 144*     Recent Results (from the past 240 hour(s))  MRSA PCR Screening     Status: None   Collection Time: 10/26/18  6:10 PM  Result Value Ref Range Status   MRSA by PCR NEGATIVE NEGATIVE Final    Comment:        The GeneXpert MRSA Assay (FDA approved for NASAL specimens only), is one component of a comprehensive MRSA colonization surveillance program. It is not intended to diagnose MRSA infection nor to guide or monitor treatment for MRSA infections. Performed at [facility], [address] [place], [place] [address]   Culture, blood (Routine X 2) w Reflex to ID Panel     Status: None (Preliminary result)   Collection Time: 10/26/18  7:41 PM  Result Value Ref Range Status   Specimen Description SITE NOT SPECIFIED  Final   Special Requests   Final    BOTTLES DRAWN AEROBIC ONLY Blood Culture adequate volume Performed at [facility], [address] [place], [place] [address]    Culture NO GROWTH 3 DAYS  Final   Report Status PENDING  Incomplete  Culture, blood (Routine X 2) w Reflex to ID Panel     Status: None  (Preliminary result)   Collection Time: 10/26/18  7:41 PM  Result Value Ref Range Status   Specimen Description BLOOD LEFT HAND  Final   Special Requests   Final    BOTTLES DRAWN AEROBIC ONLY Blood Culture adequate volume Performed at [facility], [address] [place], [place] [address]    Culture NO GROWTH 3 DAYS  Final   Report Status PENDING  Incomplete  Respiratory Panel by PCR     Status: Abnormal   Collection Time: 10/26/18  7:56 PM  Result Value Ref Range Status   Adenovirus NOT DETECTED NOT DETECTED Final   Coronavirus 229E NOT DETECTED NOT DETECTED Final    Comment: (NOTE) The Coronavirus on the Respiratory Panel, DOES NOT test for the novel  Coronavirus (2019 nCoV)    Coronavirus HKU1 NOT DETECTED NOT DETECTED Final   Coronavirus NL63 NOT DETECTED NOT DETECTED Final   Coronavirus OC43 NOT DETECTED NOT DETECTED Final   Metapneumovirus NOT DETECTED NOT DETECTED Final   Rhinovirus / Enterovirus NOT DETECTED NOT DETECTED Final   Influenza A H1 2009 DETECTED (A) NOT DETECTED Final   Influenza B NOT DETECTED NOT DETECTED Final   Parainfluenza Virus 1 NOT DETECTED NOT DETECTED Final   Parainfluenza Virus 2 NOT DETECTED NOT DETECTED Final   Parainfluenza Virus 3 NOT DETECTED NOT DETECTED Final   Parainfluenza Virus 4 NOT DETECTED NOT DETECTED Final   Respiratory Syncytial Virus NOT DETECTED NOT DETECTED Final   Bordetella pertussis NOT DETECTED NOT DETECTED Final   Chlamydophila pneumoniae NOT DETECTED NOT DETECTED Final   Mycoplasma pneumoniae NOT DETECTED NOT DETECTED Final    Comment: Performed at [facility], [address] [place], [place] [address]  Culture, respiratory (non-expectorated)     Status: None   Collection Time: 10/26/18  7:56 PM  Result Value Ref Range Status   Specimen Description TRACHEAL ASPIRATE  Final   Special Requests NONE  Final   Gram Stain   Final    ABUNDANT WBC PRESENT, PREDOMINANTLY PMN FEW GRAM POSITIVE COCCI FEW GRAM  POSITIVE RODS    Culture   Final    Consistent with normal respiratory flora. Performed at [facility], [address], [place], [place] [address]    Report Status 10/29/2018 FINAL  Final     Time spent in discharge (includes decision making & examination of pt): 35 minutes  10/30/2018, 11:18 AM   Lonia Blood, MD [facility] Office  [phone]

## 2018-10-30 NOTE — Progress Notes (Signed)
Lisa Boone to be D/C'd home per MD order.  Discussed with the patient and mother all questions fully answered.  VSS, Skin clean, dry and intact without evidence of skin break down, no evidence of skin tears noted. IV catheter discontinued intact. Site without signs and symptoms of complications. Dressing and pressure applied.  An After Visit Summary was printed and given to the patient. Patient received prescription.  D/c education completed with patient/family including follow up instructions, medication list, d/c activities limitations if indicated, with other d/c instructions as indicated by MD - patient able to verbalize understanding, all questions fully answered.   Patient instructed to return to ED, call 911, or call MD for any changes in condition.   Patient escorted via WC, and D/C home via private auto.   [name] 10/30/2018 12:11 PM

## 2018-10-30 NOTE — Discharge Instructions (Signed)
Seizure, Adult A seizure is a sudden burst of abnormal electrical activity in the brain. The abnormal activity temporarily interrupts normal brain function, causing a person to experience any of the following:  Involuntary movements.  Changes in awareness or consciousness.  Uncontrollable shaking (convulsions). Seizures usually last from 30 seconds to 2 minutes. They usually do not cause permanent brain damage unless they are prolonged. What are the causes? This condition may be caused by:  Fever.  Low blood sugar.  Medicine.  Illness.  Brain injury.  Brain tumor.  Stroke.  A condition that is passed from parent to child (genetic).  Addiction to a substance (substanceuse disorder) or suddenly stopping the use of a substance (withdrawal). Some people who have a seizure never have another one. People who have repeated seizures have a condition called epilepsy. What are the signs or symptoms? Symptoms of this condition vary greatly from person to person. They include:  Convulsions.  Stiffening of the body.  Involuntary movements of the arms or legs.  Loss of consciousness.  Breathing problems.  Falling suddenly.  Confusion.  Head nodding.  Eye blinking or fluttering.  Lip smacking or tongue biting.  Drooling.  Rapid eye movements.  Grunting.  Loss of bladder control and bowel control.  Staring.  Unresponsiveness. Some people have symptoms right before a seizure happens (aura) and right after a seizure happens.  Symptoms that may occur before a seizure include: ? Fear or anxiety. ? Nausea. ? Feeling like the room is spinning (vertigo). ? A feeling of having seen or heard something before (dj vu). ? Odd tastes or smells. ? Changes in vision, such as seeing flashing lights or spots.  Symptoms that may occur after a seizure include: ? Confusion. ? Sleepiness. ? Headache. ? Weakness on one side of the body. How is this diagnosed? This  condition may be diagnosed based on medical history and physical exam. You may also have other tests, including:  Blood tests.  Electroencephalogram, EEG.  CT scan.  MRI.  Spinal tap (lumbar puncture). How is this treated? Most seizures will stop on their own in under 5 minutes and no treatment is needed. Seizures lasting longer than 5 minutes will usually need treatment. Treatment includes:  Medicines given through IV.  Avoiding known triggers, such as medicines that you take for another condition.  Medicines to treat epilepsy (antiepileptics), if epilepsy caused your seizures.  Surgery to stop seizures, if you have epilepsy that does not respond to medicines. Follow these instructions at home: Medicines  Take over-the-counter and prescription medicines only as told by your health care provider.  Avoid any substances that may prevent your medicine from working properly, such as alcohol. Activity  Do not drive, swim, or do any other activities that would be dangerous if you had another seizure. Wait until your health care provider says it is safe to do them.  If you live in the [place] check with your local DMV (department of motor vehicles) to find out about local driving laws. Each state has specific rules about when you can legally return to driving.  Get enough rest. Lack of sleep can make seizures more likely to occur. Educating others Teach friends and family what to do if you have a seizure. They should:  Lay you on the ground to prevent a fall.  Cushion your head and body.  Loosen any tight clothing around your neck.  Turn you on your side. If vomiting occurs, this helps keep your airway clear.  Not hold you down. Holding you down will not stop the seizure.  Not put anything into your mouth.  Know whether or not you need emergency care.  Stay with you until you recover.  General instructions  Contact your health care provider each time you have a  seizure.  Avoid anything that has ever triggered a seizure for you.  Keep a seizure diary. Record what you remember about each seizure, especially anything that might have triggered the seizure.  Keep all follow-up visits as told by your health care provider. This is important. Contact a health care provider if:  You have another seizure.  You have seizures more often.  Your seizure symptoms change.  You continue to have seizures with treatment.  You have symptoms of an infection or illness. These might increase your risk of having a seizure. Get help right away if:  You have a seizure that: ? Lasts longer than 5 minutes. ? Is different than previous seizures. ? Leaves you unable to speak or use a part of your body. ? Makes it harder to breathe.  You have: ? A seizure after a head injury. ? Multiple seizures in a row. ? Confusion or a severe headache right after a seizure.  You are having seizures more often.  You do not wake up immediately after a seizure.  You injure yourself during a seizure. These symptoms may represent a serious problem that is an emergency. Do not wait to see if the symptoms will go away. Get medical help right away. Call your local emergency services (911 in the [place] Do not drive yourself to the hospital. Summary  Seizures are caused by abnormal electrical activity in the brain. The activity disrupts normal brain function, leading to a change in consciousness, abnormal movements, or convulsions.  There are many causes of seizures including illnesses, medicines, genetic conditions, head injuries, strokes, tumors, substance abuse, or substance withdrawal.  Most seizures will stop on their own in under 5 minutes. Seizures lasting longer than 5 minutes are a medical emergency and require immediate treatment.  There are many medicines that are used to treat seizures. Take over-the-counter and prescription medicines only as told by your health care  provider. This information is not intended to replace advice given to you by your health care provider. Make sure you discuss any questions you have with your health care provider. Document Released: 08/12/2000 Document Revised: 09/21/2017 Document Reviewed: 09/21/2017 Elsevier Interactive Patient Education  2019 [employer].   Influenza, Adult Influenza, more commonly known as "the flu," is a viral infection that mainly affects the respiratory tract. The respiratory tract includes organs that help you breathe, such as the lungs, nose, and throat. The flu causes many symptoms similar to the common cold along with high fever and body aches. The flu spreads easily from person to person (is contagious). Getting a flu shot (influenza vaccination) every year is the best way to prevent the flu. What are the causes? This condition is caused by the influenza virus. You can get the virus by:  Breathing in droplets that are in the air from an infected person's cough or sneeze.  Touching something that has been exposed to the virus (has been contaminated) and then touching your mouth, nose, or eyes. What increases the risk? The following factors may make you more likely to get the flu:  Not washing or sanitizing your hands often.  Having close contact with many people during cold and flu season.  Touching your  mouth, eyes, or nose without first washing or sanitizing your hands.  Not getting a yearly (annual) flu shot. You may have a higher risk for the flu, including serious problems such as a lung infection (pneumonia), if you:  Are older than 65.  Are pregnant.  Have a weakened disease-fighting system (immune system). You may have a weakened immune system if you: ? Have HIV or AIDS. ? Are undergoing chemotherapy. ? Are taking medicines that reduce (suppress) the activity of your immune system.  Have a long-term (chronic) illness, such as heart disease, kidney disease, diabetes, or lung  disease.  Have a liver disorder.  Are severely overweight (morbidly obese).  Have anemia. This is a condition that affects your red blood cells.  Have asthma. What are the signs or symptoms? Symptoms of this condition usually begin suddenly and last 4-14 days. They may include:  Fever and chills.  Headaches, body aches, or muscle aches.  Sore throat.  Cough.  Runny or stuffy (congested) nose.  Chest discomfort.  Poor appetite.  Weakness or fatigue.  Dizziness.  Nausea or vomiting. How is this diagnosed? This condition may be diagnosed based on:  Your symptoms and medical history.  A physical exam.  Swabbing your nose or throat and testing the fluid for the influenza virus. How is this treated? If the flu is diagnosed early, you can be treated with medicine that can help reduce how severe the illness is and how long it lasts (antiviral medicine). This may be given by mouth (orally) or through an IV. Taking care of yourself at home can help relieve symptoms. Your health care provider may recommend:  Taking over-the-counter medicines.  Drinking plenty of fluids. In many cases, the flu goes away on its own. If you have severe symptoms or complications, you may be treated in a hospital. Follow these instructions at home: Activity  Rest as needed and get plenty of sleep.  Stay home from work or school as told by your health care provider. Unless you are visiting your health care provider, avoid leaving home until your fever has been gone for 24 hours without taking medicine. Eating and drinking  Take an oral rehydration solution (ORS). This is a drink that is sold at pharmacies and retail stores.  Drink enough fluid to keep your urine pale yellow.  Drink clear fluids in small amounts as you are able. Clear fluids include water, ice chips, diluted fruit juice, and low-calorie sports drinks.  Eat bland, easy-to-digest foods in small amounts as you are able. These  foods include bananas, applesauce, rice, lean meats, toast, and crackers.  Avoid drinking fluids that contain a lot of sugar or caffeine, such as energy drinks, regular sports drinks, and soda.  Avoid alcohol.  Avoid spicy or fatty foods. General instructions      Take over-the-counter and prescription medicines only as told by your health care provider.  Use a cool mist humidifier to add humidity to the air in your home. This can make it easier to breathe.  Cover your mouth and nose when you cough or sneeze.  Wash your hands with soap and water often, especially after you cough or sneeze. If soap and water are not available, use alcohol-based hand sanitizer.  Keep all follow-up visits as told by your health care provider. This is important. How is this prevented?   Get an annual flu shot. You may get the flu shot in late summer, fall, or winter. Ask your health care provider when  you should get your flu shot.  Avoid contact with people who are sick during cold and flu season. This is generally fall and winter. Contact a health care provider if:  You develop new symptoms.  You have: ? Chest pain. ? Diarrhea. ? A fever.  Your cough gets worse.  You produce more mucus.  You feel nauseous or you vomit. Get help right away if:  You develop shortness of breath or difficulty breathing.  Your skin or nails turn a bluish color.  You have severe pain or stiffness in your neck.  You develop a sudden headache or sudden pain in your face or ear.  You cannot eat or drink without vomiting. Summary  Influenza, more commonly known as "the flu," is a viral infection that primarily affects your respiratory tract.  Symptoms of the flu usually begin suddenly and last 4-14 days.  Getting an annual flu shot is the best way to prevent getting the flu.  Stay home from work or school as told by your health care provider. Unless you are visiting your health care provider, avoid  leaving home until your fever has been gone for 24 hours without taking medicine.  Keep all follow-up visits as told by your health care provider. This is important. This information is not intended to replace advice given to you by your health care provider. Make sure you discuss any questions you have with your health care provider. Document Released: 08/12/2000 Document Revised: 01/31/2018 Document Reviewed: 01/31/2018 Elsevier Interactive Patient Education  2019 [employer].

## 2018-10-31 LAB — CULTURE, BLOOD (ROUTINE X 2)
Culture: NO GROWTH
Culture: NO GROWTH
Special Requests: ADEQUATE
Special Requests: ADEQUATE

## 2018-11-06 DIAGNOSIS — G4709 Other insomnia: Secondary | ICD-10-CM | POA: Diagnosis not present

## 2018-11-06 DIAGNOSIS — G40311 Generalized idiopathic epilepsy and epileptic syndromes, intractable, with status epilepticus: Secondary | ICD-10-CM | POA: Diagnosis not present

## 2018-11-06 DIAGNOSIS — Z79899 Other long term (current) drug therapy: Secondary | ICD-10-CM | POA: Diagnosis not present

## 2018-11-06 DIAGNOSIS — F419 Anxiety disorder, unspecified: Secondary | ICD-10-CM | POA: Diagnosis not present

## 2018-11-08 DIAGNOSIS — G40909 Epilepsy, unspecified, not intractable, without status epilepticus: Secondary | ICD-10-CM | POA: Diagnosis not present

## 2018-11-08 DIAGNOSIS — J45909 Unspecified asthma, uncomplicated: Secondary | ICD-10-CM | POA: Diagnosis not present

## 2019-01-01 DIAGNOSIS — Z79899 Other long term (current) drug therapy: Secondary | ICD-10-CM | POA: Diagnosis not present

## 2019-01-01 DIAGNOSIS — F419 Anxiety disorder, unspecified: Secondary | ICD-10-CM | POA: Diagnosis not present

## 2019-01-01 DIAGNOSIS — G40311 Generalized idiopathic epilepsy and epileptic syndromes, intractable, with status epilepticus: Secondary | ICD-10-CM | POA: Diagnosis not present

## 2019-01-01 DIAGNOSIS — G4709 Other insomnia: Secondary | ICD-10-CM | POA: Diagnosis not present

## 2019-01-29 DIAGNOSIS — G4709 Other insomnia: Secondary | ICD-10-CM | POA: Diagnosis not present

## 2019-01-29 DIAGNOSIS — F419 Anxiety disorder, unspecified: Secondary | ICD-10-CM | POA: Diagnosis not present

## 2019-01-29 DIAGNOSIS — G40311 Generalized idiopathic epilepsy and epileptic syndromes, intractable, with status epilepticus: Secondary | ICD-10-CM | POA: Diagnosis not present

## 2019-01-29 DIAGNOSIS — Z79899 Other long term (current) drug therapy: Secondary | ICD-10-CM | POA: Diagnosis not present

## 2019-02-26 DIAGNOSIS — G4709 Other insomnia: Secondary | ICD-10-CM | POA: Diagnosis not present

## 2019-02-26 DIAGNOSIS — G40311 Generalized idiopathic epilepsy and epileptic syndromes, intractable, with status epilepticus: Secondary | ICD-10-CM | POA: Diagnosis not present

## 2019-02-26 DIAGNOSIS — Z79899 Other long term (current) drug therapy: Secondary | ICD-10-CM | POA: Diagnosis not present

## 2019-02-26 DIAGNOSIS — F419 Anxiety disorder, unspecified: Secondary | ICD-10-CM | POA: Diagnosis not present

## 2019-04-04 DIAGNOSIS — G40311 Generalized idiopathic epilepsy and epileptic syndromes, intractable, with status epilepticus: Secondary | ICD-10-CM | POA: Diagnosis not present

## 2019-04-04 DIAGNOSIS — F419 Anxiety disorder, unspecified: Secondary | ICD-10-CM | POA: Diagnosis not present

## 2019-04-04 DIAGNOSIS — Z79899 Other long term (current) drug therapy: Secondary | ICD-10-CM | POA: Diagnosis not present

## 2019-04-04 DIAGNOSIS — G4709 Other insomnia: Secondary | ICD-10-CM | POA: Diagnosis not present

## 2019-05-05 DIAGNOSIS — Z79899 Other long term (current) drug therapy: Secondary | ICD-10-CM | POA: Diagnosis not present

## 2019-05-05 DIAGNOSIS — R1011 Right upper quadrant pain: Secondary | ICD-10-CM | POA: Diagnosis not present

## 2019-05-05 DIAGNOSIS — R109 Unspecified abdominal pain: Secondary | ICD-10-CM | POA: Diagnosis not present

## 2019-05-05 DIAGNOSIS — K5900 Constipation, unspecified: Secondary | ICD-10-CM | POA: Diagnosis not present

## 2019-05-05 DIAGNOSIS — F17200 Nicotine dependence, unspecified, uncomplicated: Secondary | ICD-10-CM | POA: Diagnosis not present

## 2019-05-05 DIAGNOSIS — R1110 Vomiting, unspecified: Secondary | ICD-10-CM | POA: Diagnosis not present

## 2019-05-05 DIAGNOSIS — N390 Urinary tract infection, site not specified: Secondary | ICD-10-CM | POA: Diagnosis not present

## 2019-05-05 DIAGNOSIS — G40909 Epilepsy, unspecified, not intractable, without status epilepticus: Secondary | ICD-10-CM | POA: Diagnosis not present

## 2019-05-09 DIAGNOSIS — Z79899 Other long term (current) drug therapy: Secondary | ICD-10-CM | POA: Diagnosis not present

## 2019-05-09 DIAGNOSIS — G4709 Other insomnia: Secondary | ICD-10-CM | POA: Diagnosis not present

## 2019-05-09 DIAGNOSIS — G40311 Generalized idiopathic epilepsy and epileptic syndromes, intractable, with status epilepticus: Secondary | ICD-10-CM | POA: Diagnosis not present

## 2019-05-09 DIAGNOSIS — F419 Anxiety disorder, unspecified: Secondary | ICD-10-CM | POA: Diagnosis not present

## 2019-05-21 IMAGING — DX DG CHEST 1V PORT
1 series · 2 of 2 positions shown · non-contrast
Comparison: 10/26/2018

CLINICAL DATA: Intubated

EXAM:
PORTABLE CHEST 1 VIEW

[Series 1: chest ap · 0.14mm/px · 2 of 2 slices shown]
[im 1/2]
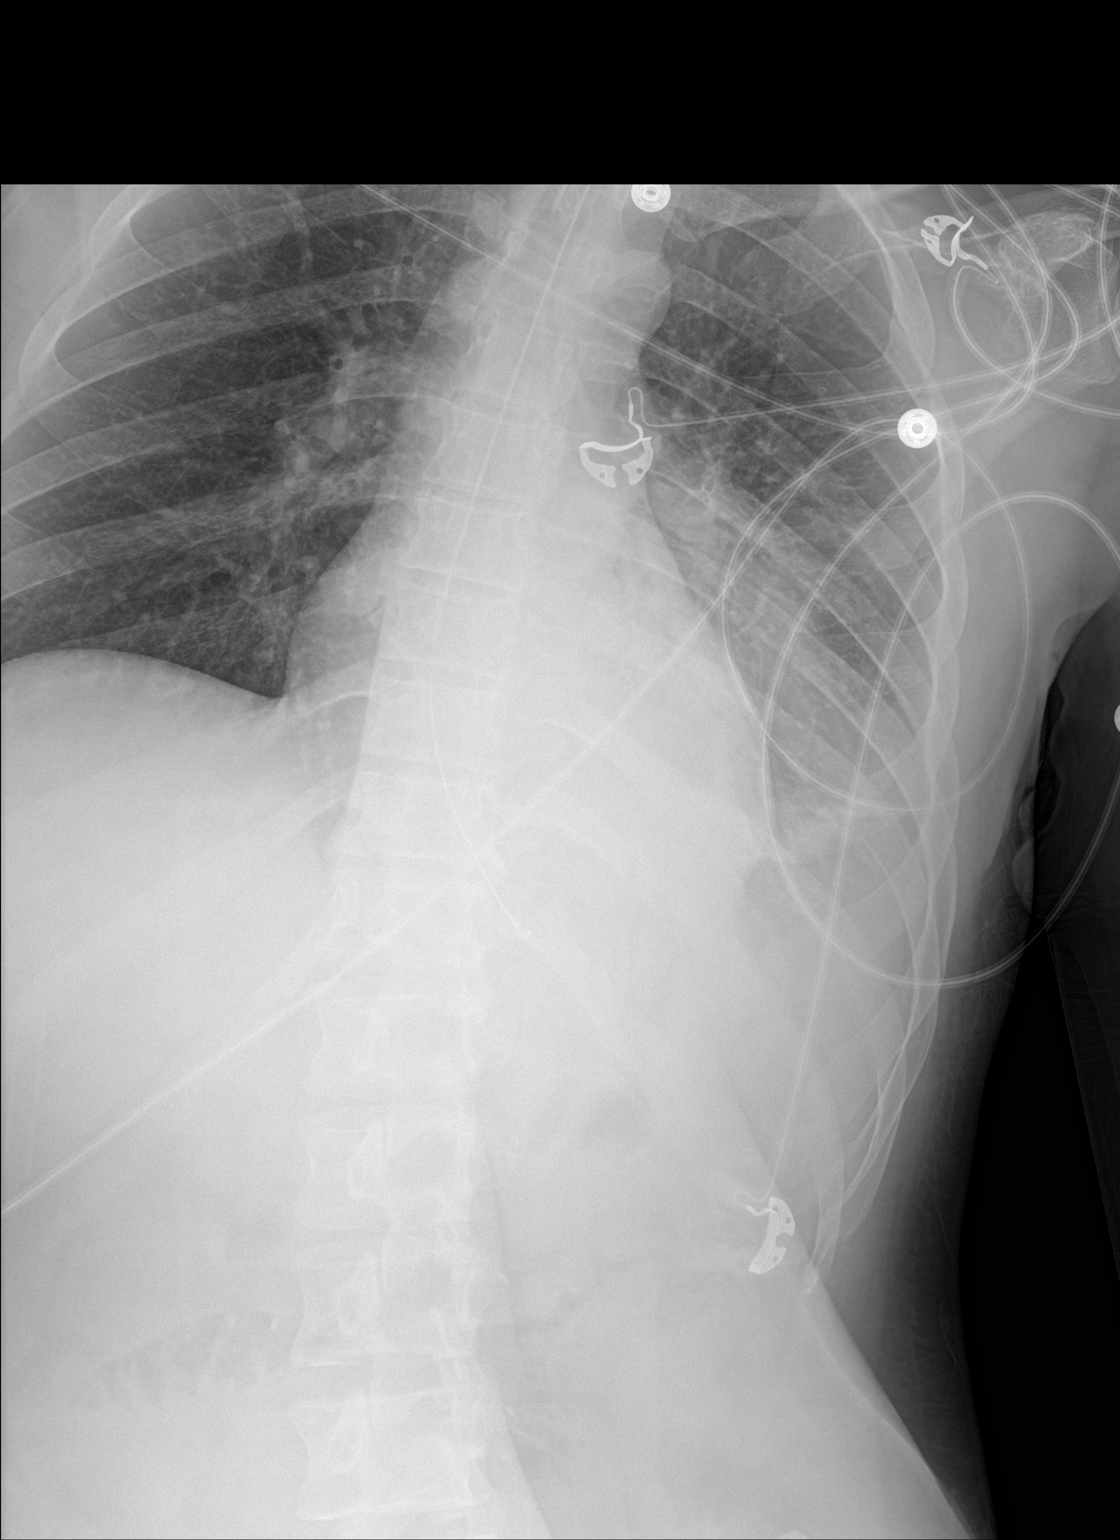
[im 2/2]
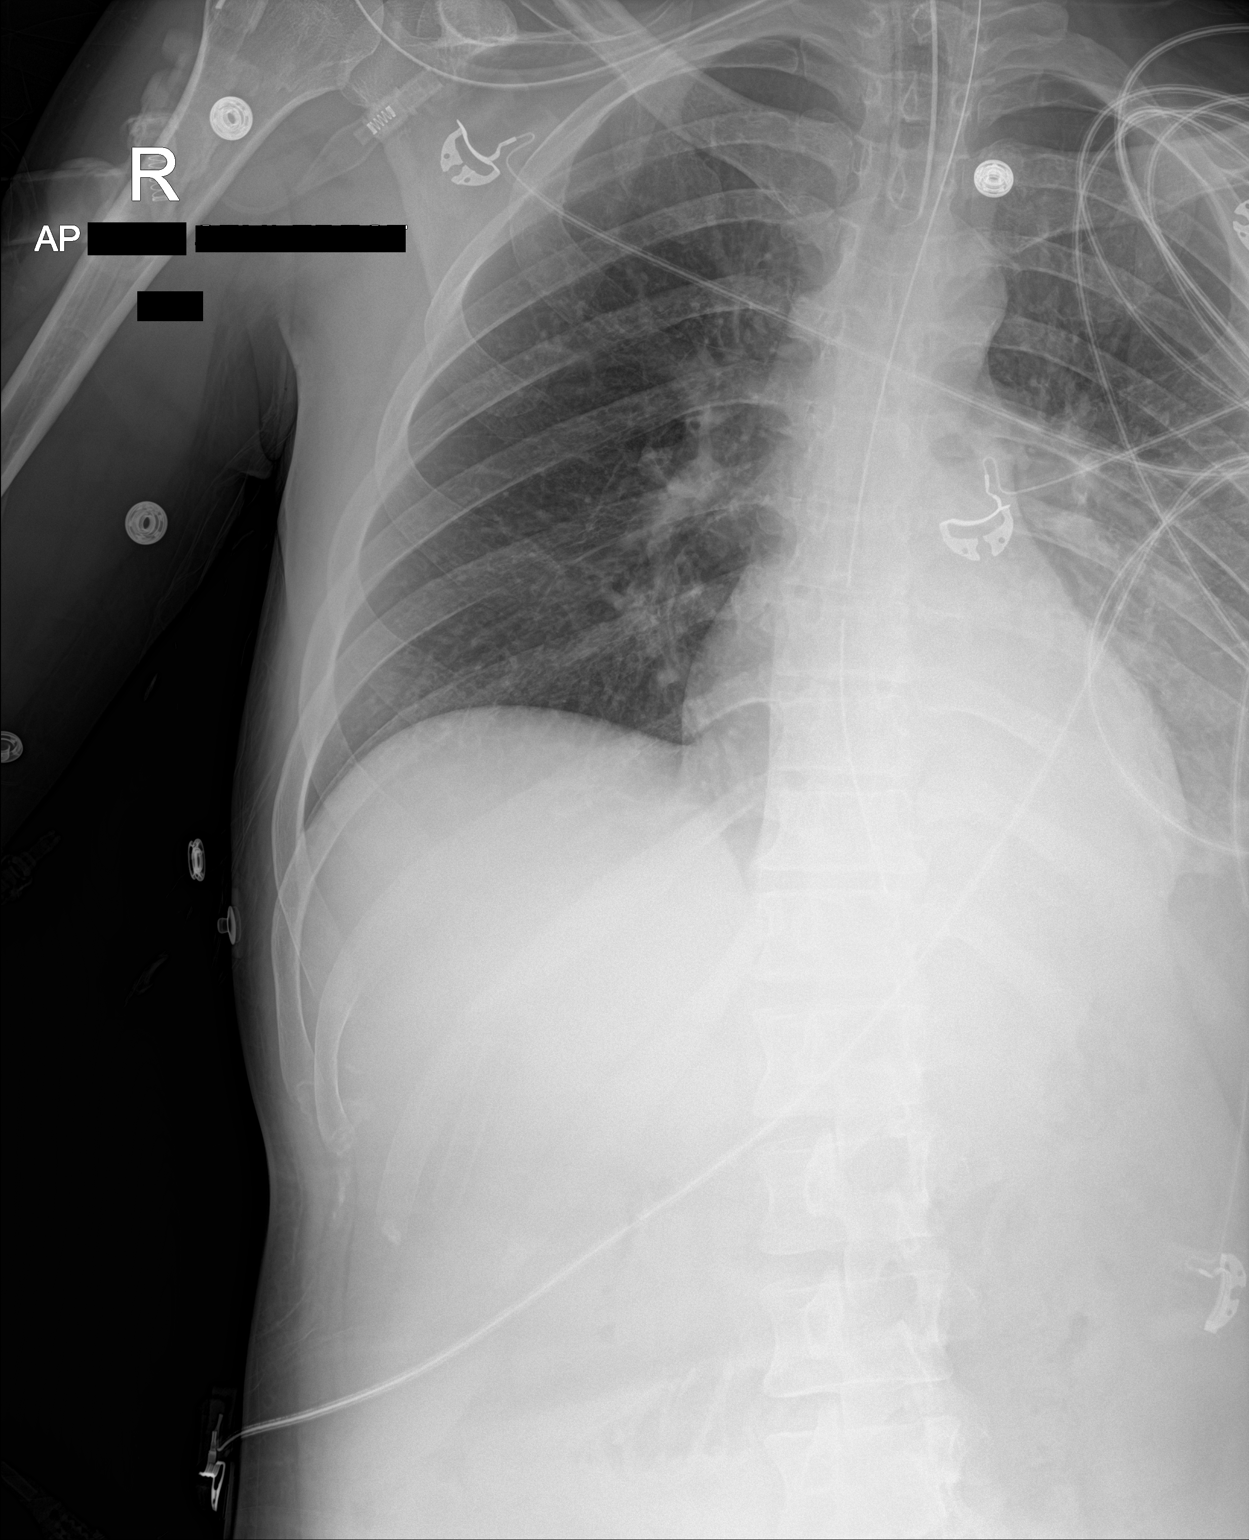

[2 of 2 positions shown; findings below may reference images not displayed]

FINDINGS: Endotracheal tube is 4 cm above the carina. NG tube tip is in the
proximal stomach with the side port in the distal esophagus. New
airspace disease in the left lower lobe. Right lung appears clear.
Heart is normal size. Possible small left effusion.
IMPRESSION: Endotracheal tube 4 cm above the carina. NG tube tip in the proximal
stomach near the GE junction.

New left lower lobe consolidation with small left effusion.

## 2021-05-14 ENCOUNTER — Other Ambulatory Visit: Payer: Self-pay | Admitting: Neurosurgery

## 2021-05-24 ENCOUNTER — Other Ambulatory Visit: Payer: Self-pay | Admitting: Neurosurgery

## 2021-05-26 NOTE — Progress Notes (Signed)
Surgical Instructions    Your procedure is scheduled on Monday October 3.  Report to [facility] Main Entrance "A" at 5:30 A.M., then check in with the Admitting office.  Call this number if you have problems the morning of surgery:  [phone]   If you have any questions prior to your surgery date call [phone]: Open Monday-Friday 8am-4pm    Remember:  Do not eat or drink anything after midnight the night before your surgery.      Take these medicines the morning of surgery with A SIP OF WATER: LamoTRIgine   As of today, STOP taking any Aspirin (unless otherwise instructed by your surgeon) Aleve, Naproxen, Ibuprofen, Motrin, Advil, Goody's, BC's, all herbal medications, fish oil, and all vitamins.          Do not wear jewelry or makeup Do not wear lotions, powders, perfumes/colognes, or deodorant. Do not shave 48 hours prior to surgery.  Men may shave face and neck. Do not bring valuables to the hospital. DO Not wear nail polish, gel polish, artificial nails, or any other type of covering on natural nails including finger and toenails. If patients have artificial nails, gel coating, etc. that need to be removed by a nail salon please have this removed prior to surgery or surgery may need to be canceled/delayed if the surgeon/ anesthesia feels like the patient is unable to be adequately monitored.             [facility] is not responsible for any belongings or valuables.  Do NOT Smoke (Tobacco/Vaping)  24 hours prior to your procedure If you use a CPAP at night, you may bring your mask for your overnight stay.   Contacts, glasses, dentures or bridgework may not be worn into surgery, please bring cases for these belongings   For patients admitted to the hospital, discharge time will be determined by your treatment team.   Patients discharged the day of surgery will not be allowed to drive home, and someone needs to stay with them for 24 hours.  NO VISITORS WILL BE ALLOWED  IN PRE-OP WHERE PATIENTS GET READY FOR SURGERY.  ONLY 1 SUPPORT PERSON MAY BE PRESENT IN THE WAITING ROOM WHILE YOU ARE IN SURGERY.  IF YOU ARE TO BE ADMITTED, ONCE YOU ARE IN YOUR ROOM YOU WILL BE ALLOWED TWO (2) VISITORS.  Minor children may have two parents present. Special consideration for safety and communication needs will be reviewed on a case by case basis.  Special instructions:    Oral Hygiene is also important to reduce your risk of infection.  Remember - BRUSH YOUR TEETH THE MORNING OF SURGERY WITH YOUR REGULAR TOOTHPASTE   [facility]- Preparing For Surgery  Before surgery, you can play an important role. Because skin is not sterile, your skin needs to be as free of germs as possible. You can reduce the number of germs on your skin by washing with CHG (chlorahexidine gluconate) Soap before surgery.  CHG is an antiseptic cleaner which kills germs and bonds with the skin to continue killing germs even after washing.     Please do not use if you have an allergy to CHG or antibacterial soaps. If your skin becomes reddened/irritated stop using the CHG.  Do not shave (including legs and underarms) for at least 48 hours prior to first CHG shower. It is OK to shave your face.  Please follow these instructions carefully.     Shower the NIGHT BEFORE SURGERY and the [facility]  OF SURGERY with CHG Soap.   If you chose to wash your hair, wash your hair first as usual with your normal shampoo. After you shampoo, rinse your hair and body thoroughly to remove the shampoo.  Then [employer] and genitals (private parts) with your normal soap and rinse thoroughly to remove soap.  After that Use CHG Soap as you would any other liquid soap. You can apply CHG directly to the skin and wash gently with a scrungie or a clean washcloth.   Apply the CHG Soap to your body ONLY FROM THE NECK DOWN.  Do not use on open wounds or open sores. Avoid contact with your eyes, ears, mouth and genitals (private parts).  Wash Face and genitals (private parts)  with your normal soap.   Wash thoroughly, paying special attention to the area where your surgery will be performed.  Thoroughly rinse your body with warm water from the neck down.  DO NOT shower/wash with your normal soap after using and rinsing off the CHG Soap.  Pat yourself dry with a CLEAN TOWEL.  Wear CLEAN PAJAMAS to bed the night before surgery  Place CLEAN SHEETS on your bed the night before your surgery  DO NOT SLEEP WITH PETS.   Day of Surgery:  Take a shower with CHG soap. Wear Clean/Comfortable clothing the morning of surgery Do not apply any deodorants/lotions.   Remember to brush your teeth WITH YOUR REGULAR TOOTHPASTE.   Please read over the following fact sheets that you were given.

## 2021-05-27 ENCOUNTER — Other Ambulatory Visit: Payer: Self-pay

## 2021-05-27 ENCOUNTER — Encounter (HOSPITAL_COMMUNITY)
Admission: RE | Admit: 2021-05-27 | Discharge: 2021-05-27 | Disposition: A | Discharge status: Home / Self Care | Payer: 59 | Source: Ambulatory Visit | Attending: Neurosurgery | Admitting: Neurosurgery

## 2021-05-27 ENCOUNTER — Encounter (HOSPITAL_COMMUNITY): Payer: Self-pay

## 2021-05-27 DIAGNOSIS — Z01812 Encounter for preprocedural laboratory examination: Secondary | ICD-10-CM | POA: Insufficient documentation

## 2021-05-27 DIAGNOSIS — Z20822 Contact with and (suspected) exposure to covid-19: Secondary | ICD-10-CM | POA: Diagnosis not present

## 2021-05-27 HISTORY — DX: Depression, unspecified: F32.A

## 2021-05-27 LAB — BASIC METABOLIC PANEL
Anion gap: 10 (ref 5–15)
BUN: 5 mg/dL — ABNORMAL LOW (ref 6–20)
CO2: 24 mmol/L (ref 22–32)
Calcium: 9.1 mg/dL (ref 8.9–10.3)
Chloride: 103 mmol/L (ref 98–111)
Creatinine, Ser: 0.67 mg/dL (ref 0.44–1.00)
GFR, Estimated: >60 mL/min (ref >60)
Glucose, Bld: 80 mg/dL (ref 70–99)
Potassium: 4.1 mmol/L (ref 3.5–5.1)
Sodium: 137 mmol/L (ref 135–145)

## 2021-05-27 LAB — CBC
HCT: 47.4 % — ABNORMAL HIGH (ref 36.0–46.0)
Hemoglobin: 15.2 g/dL — ABNORMAL HIGH (ref 12.0–15.0)
MCH: 30.4 pg (ref 26.0–34.0)
MCHC: 32.1 g/dL (ref 30.0–36.0)
MCV: 94.8 fL (ref 80.0–100.0)
Platelets: 263 10*3/uL (ref 150–400)
RBC: 5 MIL/uL (ref 3.87–5.11)
RDW: 12.6 % (ref 11.5–15.5)
WBC: 8.4 10*3/uL (ref 4.0–10.5)
nRBC: 0 % (ref 0.0–0.2)

## 2021-05-27 LAB — SARS CORONAVIRUS 2 (TAT 6-24 HRS): SARS Coronavirus 2: NEGATIVE

## 2021-05-27 NOTE — Progress Notes (Signed)
PCP - Fara Chute MD, [facility] Internal Medicine, [facility] Cardiologist - none Neurology-Ayeshia Lowell Guitar NP  PPM/ICD - denies Device Orders -  Rep Notified -   Chest x-ray - 10/27/18 EKG - 03/23/21 Stress Test - none ECHO - none Cardiac Cath - none  Sleep Study - none CPAP - no  Fasting Blood Sugar - n/a Checks Blood Sugar _____ times a day  Blood Thinner Instructions:n/a Aspirin Instructions:n/a  ERAS Protcol -no PRE-SURGERY Ensure or G2-   COVID TEST- yes 05/27/21   Anesthesia review: no  Patient denies shortness of breath, fever, cough and chest pain at PAT appointment   All instructions explained to the patient, with a verbal understanding of the material. Patient agrees to go over the instructions while at home for a better understanding. Patient also instructed to self quarantine after being tested for COVID-19. The opportunity to ask questions was provided.

## 2021-05-30 NOTE — Anesthesia Preprocedure Evaluation (Addendum)
Anesthesia Evaluation  Patient identified by MRN, date of birth, ID band Patient awake    Reviewed: Allergy & Precautions, NPO status , Patient's Chart, lab work & pertinent test results  Airway Mallampati: II  TM Distance: >3 FB     Dental   Pulmonary Current Smoker and Patient abstained from smoking.,    breath sounds clear to auscultation       Cardiovascular negative cardio ROS   Rhythm:Regular Rate:Normal     Neuro/Psych Seizures -,     GI/Hepatic negative GI ROS, Neg liver ROS,   Endo/Other  negative endocrine ROS  Renal/GU negative Renal ROS     Musculoskeletal   Abdominal   Peds  Hematology   Anesthesia Other Findings   Reproductive/Obstetrics                            Anesthesia Physical Anesthesia Plan  ASA: 3  Anesthesia Plan: General   Post-op Pain Management:    Induction: Intravenous  PONV Risk Score and Plan: Ondansetron, Dexamethasone, Midazolam and Treatment may vary due to age or medical condition  Airway Management Planned: Oral ETT  Additional Equipment:   Intra-op Plan:   Post-operative Plan: Extubation in OR  Informed Consent: I have reviewed the patients History and Physical, chart, labs and discussed the procedure including the risks, benefits and alternatives for the proposed anesthesia with the patient or authorized representative who has indicated his/her understanding and acceptance.     Dental advisory given  Plan Discussed with: Anesthesiologist and CRNA  Anesthesia Plan Comments:        Anesthesia Quick Evaluation

## 2021-05-31 ENCOUNTER — Other Ambulatory Visit: Payer: Self-pay

## 2021-05-31 ENCOUNTER — Inpatient Hospital Stay (HOSPITAL_COMMUNITY)
Admission: RE | Admit: 2021-05-31 | Discharge: 2021-05-31 | DRG: 041 | Disposition: A | Discharge status: Home / Self Care | Payer: 59 | Attending: Neurosurgery | Admitting: Neurosurgery

## 2021-05-31 ENCOUNTER — Inpatient Hospital Stay (HOSPITAL_COMMUNITY): Payer: 59 | Admitting: Anesthesiology

## 2021-05-31 ENCOUNTER — Encounter (HOSPITAL_COMMUNITY): Payer: Self-pay | Admitting: Neurosurgery

## 2021-05-31 ENCOUNTER — Inpatient Hospital Stay (HOSPITAL_COMMUNITY)
Admission: RE | Disposition: A | Discharge status: Home / Self Care | Payer: Self-pay | Source: Home / Self Care | Attending: Neurosurgery

## 2021-05-31 DIAGNOSIS — Z006 Encounter for examination for normal comparison and control in clinical research program: Secondary | ICD-10-CM | POA: Diagnosis not present

## 2021-05-31 DIAGNOSIS — F17210 Nicotine dependence, cigarettes, uncomplicated: Secondary | ICD-10-CM | POA: Diagnosis present

## 2021-05-31 DIAGNOSIS — G40919 Epilepsy, unspecified, intractable, without status epilepticus: Secondary | ICD-10-CM | POA: Diagnosis present

## 2021-05-31 DIAGNOSIS — G40109 Localization-related (focal) (partial) symptomatic epilepsy and epileptic syndromes with simple partial seizures, not intractable, without status epilepticus: Secondary | ICD-10-CM

## 2021-05-31 DIAGNOSIS — F32A Depression, unspecified: Secondary | ICD-10-CM | POA: Diagnosis present

## 2021-05-31 DIAGNOSIS — Z79899 Other long term (current) drug therapy: Secondary | ICD-10-CM

## 2021-05-31 HISTORY — PX: VAGUS NERVE STIMULATOR INSERTION: SHX348

## 2021-05-31 SURGERY — VAGAL NERVE STIMULATOR IMPLANT
Anesthesia: General

## 2021-05-31 MED ORDER — THROMBIN 5000 UNITS EX SOLR
CUTANEOUS | Status: AC
  Filled 2021-05-31: qty 15000

## 2021-05-31 MED ORDER — LIDOCAINE 2% (20 MG/ML) 5 ML SYRINGE
INTRAMUSCULAR | Status: DC | PRN
  Administered 2021-05-31: 100 mg via INTRAVENOUS

## 2021-05-31 MED ORDER — DEXAMETHASONE SODIUM PHOSPHATE 4 MG/ML IJ SOLN
INTRAMUSCULAR | Status: DC | PRN
  Administered 2021-05-31: 10 mg via INTRAVENOUS

## 2021-05-31 MED ORDER — SUGAMMADEX SODIUM 200 MG/2ML IV SOLN
INTRAVENOUS | Status: DC | PRN
  Administered 2021-05-31: 200 mg via INTRAVENOUS

## 2021-05-31 MED ORDER — LIDOCAINE-EPINEPHRINE 1 %-1:100000 IJ SOLN
INTRAMUSCULAR | Status: AC
  Filled 2021-05-31: qty 1

## 2021-05-31 MED ORDER — PROPOFOL 10 MG/ML IV BOLUS
INTRAVENOUS | Status: AC
  Filled 2021-05-31: qty 20

## 2021-05-31 MED ORDER — CHLORHEXIDINE GLUCONATE CLOTH 2 % EX PADS: 6.0000 | MEDICATED_PAD | Freq: Once | CUTANEOUS | Status: DC

## 2021-05-31 MED ORDER — ROCURONIUM BROMIDE 100 MG/10ML IV SOLN
INTRAVENOUS | Status: DC | PRN
  Administered 2021-05-31: 50 mg via INTRAVENOUS

## 2021-05-31 MED ORDER — PHENYLEPHRINE 40 MCG/ML (10ML) SYRINGE FOR IV PUSH (FOR BLOOD PRESSURE SUPPORT)
PREFILLED_SYRINGE | INTRAVENOUS | Status: DC | PRN
  Administered 2021-05-31: 40 ug via INTRAVENOUS
  Administered 2021-05-31: 80 ug via INTRAVENOUS
  Administered 2021-05-31: 40 ug via INTRAVENOUS
  Administered 2021-05-31: 80 ug via INTRAVENOUS

## 2021-05-31 MED ORDER — MIDAZOLAM HCL 5 MG/5ML IJ SOLN
INTRAMUSCULAR | Status: DC | PRN
  Administered 2021-05-31: 2 mg via INTRAVENOUS

## 2021-05-31 MED ORDER — ORAL CARE MOUTH RINSE: 15.0000 mL | Freq: Once | OROMUCOSAL | Status: AC

## 2021-05-31 MED ORDER — LIDOCAINE-EPINEPHRINE 1 %-1:100000 IJ SOLN
INTRAMUSCULAR | Status: DC | PRN
  Administered 2021-05-31: 4 mL
  Administered 2021-05-31: 4.5 mL

## 2021-05-31 MED ORDER — HYDROCODONE-ACETAMINOPHEN 5-325 MG PO TABS: 1.0000 | ORAL_TABLET | Freq: Four times a day (QID) | ORAL | 0 refills | Status: AC | PRN

## 2021-05-31 MED ORDER — FENTANYL CITRATE (PF) 250 MCG/5ML IJ SOLN
INTRAMUSCULAR | Status: DC | PRN
  Administered 2021-05-31 (×2): 50 ug via INTRAVENOUS
  Administered 2021-05-31: 100 ug via INTRAVENOUS
  Administered 2021-05-31: 50 ug via INTRAVENOUS

## 2021-05-31 MED ORDER — DEXMEDETOMIDINE (PRECEDEX) IN NS 20 MCG/5ML (4 MCG/ML) IV SYRINGE
PREFILLED_SYRINGE | INTRAVENOUS | Status: DC | PRN
  Administered 2021-05-31: 8 ug via INTRAVENOUS
  Administered 2021-05-31: 4 ug via INTRAVENOUS
  Administered 2021-05-31: 8 ug via INTRAVENOUS

## 2021-05-31 MED ORDER — CEFAZOLIN SODIUM-DEXTROSE 2-4 GM/100ML-% IV SOLN
2.0000 g | INTRAVENOUS | Status: AC
  Administered 2021-05-31: 2 g via INTRAVENOUS
  Filled 2021-05-31: qty 100

## 2021-05-31 MED ORDER — FENTANYL CITRATE (PF) 250 MCG/5ML IJ SOLN
INTRAMUSCULAR | Status: AC
  Filled 2021-05-31: qty 5

## 2021-05-31 MED ORDER — FENTANYL CITRATE (PF) 100 MCG/2ML IJ SOLN: 25.0000 ug | INTRAMUSCULAR | Status: DC | PRN

## 2021-05-31 MED ORDER — ONDANSETRON HCL 4 MG/2ML IJ SOLN
INTRAMUSCULAR | Status: DC | PRN
  Administered 2021-05-31: 4 mg via INTRAVENOUS

## 2021-05-31 MED ORDER — BUPIVACAINE HCL (PF) 0.5 % IJ SOLN
INTRAMUSCULAR | Status: AC
  Filled 2021-05-31: qty 30

## 2021-05-31 MED ORDER — LACTATED RINGERS IV SOLN: INTRAVENOUS | Status: DC

## 2021-05-31 MED ORDER — MIDAZOLAM HCL 2 MG/2ML IJ SOLN
INTRAMUSCULAR | Status: AC
  Filled 2021-05-31: qty 2

## 2021-05-31 MED ORDER — PHENYLEPHRINE 40 MCG/ML (10ML) SYRINGE FOR IV PUSH (FOR BLOOD PRESSURE SUPPORT)
PREFILLED_SYRINGE | INTRAVENOUS | Status: AC
  Filled 2021-05-31: qty 10

## 2021-05-31 MED ORDER — PROPOFOL 10 MG/ML IV BOLUS
INTRAVENOUS | Status: DC | PRN
  Administered 2021-05-31: 50 mg via INTRAVENOUS
  Administered 2021-05-31: 10 mg via INTRAVENOUS

## 2021-05-31 MED ORDER — BUPIVACAINE HCL 0.5 % IJ SOLN
INTRAMUSCULAR | Status: DC | PRN
  Administered 2021-05-31: 4 mL
  Administered 2021-05-31: 4.5 mL

## 2021-05-31 MED ORDER — CHLORHEXIDINE GLUCONATE 0.12 % MT SOLN
15.0000 mL | Freq: Once | OROMUCOSAL | Status: AC
  Administered 2021-05-31: 15 mL via OROMUCOSAL
  Filled 2021-05-31: qty 15

## 2021-05-31 MED ORDER — HEMOSTATIC AGENTS (NO CHARGE) OPTIME
TOPICAL | Status: DC | PRN
  Administered 2021-05-31: 1 via TOPICAL

## 2021-05-31 SURGICAL SUPPLY — 54 items
BAG COUNTER SPONGE SURGICOUNT (BAG) ×2 IMPLANT
BENZOIN TINCTURE PRP APPL 2/3 (GAUZE/BANDAGES/DRESSINGS) IMPLANT
BLADE CLIPPER SURG (BLADE) IMPLANT
BLADE SURG 11 STRL SS (BLADE) ×2 IMPLANT
CANISTER SUCT 3000ML PPV (MISCELLANEOUS) ×2 IMPLANT
CARTRIDGE OIL MAESTRO DRILL (MISCELLANEOUS) IMPLANT
DECANTER SPIKE VIAL GLASS SM (MISCELLANEOUS) ×2 IMPLANT
DERMABOND ADVANCED (GAUZE/BANDAGES/DRESSINGS) ×1
DERMABOND ADVANCED .7 DNX12 (GAUZE/BANDAGES/DRESSINGS) ×1 IMPLANT
DIFFUSER DRILL AIR PNEUMATIC (MISCELLANEOUS) IMPLANT
DRAPE CAMERA VIDEO/LASER (DRAPES) ×2 IMPLANT
DRAPE HALF SHEET 40X57 (DRAPES) IMPLANT
DRAPE LAPAROTOMY 100X72 PEDS (DRAPES) ×2 IMPLANT
DRSG OPSITE POSTOP 3X4 (GAUZE/BANDAGES/DRESSINGS) IMPLANT
DRSG OPSITE POSTOP 4X6 (GAUZE/BANDAGES/DRESSINGS) ×2 IMPLANT
DRSG TEGADERM 4X4.75 (GAUZE/BANDAGES/DRESSINGS) ×8 IMPLANT
DURAPREP 6ML APPLICATOR 50/CS (WOUND CARE) ×2 IMPLANT
ELECT COATED BLADE 2.86 ST (ELECTRODE) ×2 IMPLANT
ELECT REM PT RETURN 9FT ADLT (ELECTROSURGICAL) ×2
ELECTRODE REM PT RTRN 9FT ADLT (ELECTROSURGICAL) ×1 IMPLANT
GAUZE 4X4 16PLY ~~LOC~~+RFID DBL (SPONGE) IMPLANT
GENERATOR MODEL 106 ASPIRE (Neuro Prosthesis/Implant) ×2 IMPLANT
GLOVE SURG ENC MOIS LTX SZ7.5 (GLOVE) ×2 IMPLANT
GLOVE SURG LTX SZ7 (GLOVE) ×2 IMPLANT
GLOVE SURG UNDER POLY LF SZ7.5 (GLOVE) ×4 IMPLANT
GOWN STRL REUS W/ TWL LRG LVL3 (GOWN DISPOSABLE) ×2 IMPLANT
GOWN STRL REUS W/ TWL XL LVL3 (GOWN DISPOSABLE) IMPLANT
GOWN STRL REUS W/TWL 2XL LVL3 (GOWN DISPOSABLE) IMPLANT
GOWN STRL REUS W/TWL LRG LVL3 (GOWN DISPOSABLE) ×2
GOWN STRL REUS W/TWL XL LVL3 (GOWN DISPOSABLE)
HEMOSTAT POWDER KIT SURGIFOAM (HEMOSTASIS) ×2 IMPLANT
KIT BASIN OR (CUSTOM PROCEDURE TRAY) ×2 IMPLANT
KIT TURNOVER KIT B (KITS) ×2 IMPLANT
LEAD PERENNIAFLEX 2-3 304 (Neuro Prosthesis/Implant) ×2 IMPLANT
LOOP VESSEL MAXI BLUE (MISCELLANEOUS) IMPLANT
LOOP VESSEL MINI RED (MISCELLANEOUS) IMPLANT
NEEDLE HYPO 22GX1.5 SAFETY (NEEDLE) ×2 IMPLANT
NS IRRIG 1000ML POUR BTL (IV SOLUTION) ×2 IMPLANT
OIL CARTRIDGE MAESTRO DRILL (MISCELLANEOUS)
PACK LAMINECTOMY NEURO (CUSTOM PROCEDURE TRAY) ×2 IMPLANT
PAD ARMBOARD 7.5X6 YLW CONV (MISCELLANEOUS) ×6 IMPLANT
SPONGE INTESTINAL PEANUT (DISPOSABLE) IMPLANT
SPONGE SURGIFOAM ABS GEL SZ50 (HEMOSTASIS) IMPLANT
SUT ETHILON 3 0 FSL (SUTURE) IMPLANT
SUT NURALON 4 0 TR CR/8 (SUTURE) ×2 IMPLANT
SUT VIC AB 0 CT1 27 (SUTURE) ×1
SUT VIC AB 0 CT1 27XBRD ANBCTR (SUTURE) ×1 IMPLANT
SUT VIC AB 3-0 SH 18 (SUTURE) IMPLANT
SUT VIC AB 3-0 SH 8-18 (SUTURE) ×4 IMPLANT
SUT VICRYL 3-0 RB1 18 ABS (SUTURE) ×6 IMPLANT
TOWEL GREEN STERILE (TOWEL DISPOSABLE) ×2 IMPLANT
TOWEL GREEN STERILE FF (TOWEL DISPOSABLE) ×2 IMPLANT
TUNNELING TOOL (MISCELLANEOUS) ×2 IMPLANT
WATER STERILE IRR 1000ML POUR (IV SOLUTION) ×2 IMPLANT

## 2021-05-31 NOTE — Discharge Summary (Signed)
  Physician Discharge Summary  Patient ID: Lisa Boone MRN: [id number] DOB/AGE: 01/27/1991 30 y.o.  Admit date: 05/31/2021 Discharge date: 05/31/2021  Admission Diagnoses:  Medically intractable epilepsy  Discharge Diagnoses:  Same Active Problems:   * No active hospital problems. *   Discharged Condition: Stable  Hospital Course:  Lisa Boone is a 30 y.o. 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 She was at baseline postop and discharged home from PACU in stable condition.  Treatments: Surgery - Placement of left VNS  Discharge Exam: Blood pressure 122/82, pulse 83, temperature 98.2 F (36.8 C), temperature source Oral, resp. rate 17, height 5\' 7"  (1.702 m), weight 86.6 kg, last menstrual period 04/22/2013, SpO2 98 %, unknown if currently breastfeeding. Awake, alert, oriented Speech fluent, appropriate CN grossly intact 5/5 BUE/BLE Wound c/d/i  Disposition: Discharge disposition: 01-Home or Self Care       Discharge Instructions     Call MD for:  redness, tenderness, or signs of infection (pain, swelling, redness, odor or green/yellow discharge around incision site)   Complete by: As directed    Call MD for:  temperature >100.4   Complete by: As directed    Diet - low sodium heart healthy   Complete by: As directed    Discharge instructions   Complete by: As directed    Walk at home as much as possible, at least 4 times / day   Increase activity slowly   Complete by: As directed    Lifting restrictions   Complete by: As directed    No lifting > 10 lbs   May shower / Bathe   Complete by: As directed    48 hours after surgery   May walk up steps   Complete by: As directed    Other Restrictions   Complete by: As directed    No bending/twisting at waist   Remove dressing in 48 hours   Complete by: As directed       Allergies as of 05/31/2021   No Known Allergies      Medication List     TAKE these medications    albuterol 108  (90 Base) MCG/ACT inhaler Commonly known as: VENTOLIN HFA Inhale 1 puff into the lungs 4 (four) times daily as needed for wheezing or shortness of breath.   amoxicillin-clavulanate 875-125 MG tablet Commonly known as: AUGMENTIN Take 1 tablet by mouth 2 (two) times daily.   Fycompa 6 MG Tabs Generic drug: Perampanel Take 6 mg by mouth at bedtime.   HYDROcodone-acetaminophen 5-325 MG tablet Commonly known as: NORCO/VICODIN Take 1 tablet by mouth every 6 (six) hours as needed for moderate pain.   LamoTRIgine 200 MG Tb24 24 hour tablet Take 200 mg by mouth daily.   levETIRAcetam 500 MG tablet Commonly known as: KEPPRA Take 1 tablet (500 mg total) by mouth 2 (two) times daily.   oseltamivir 75 MG capsule Commonly known as: TAMIFLU Take 1 capsule (75 mg total) by mouth 2 (two) times daily.   venlafaxine XR 75 MG 24 hr capsule Commonly known as: EFFEXOR-XR Take 75 mg by mouth at bedtime.        Follow-up Information     07/31/2021, MD Follow up in 2 week(s).   Specialty: Neurosurgery Contact information: [address] [place] [phone]                 Signed: [phone] 05/31/2021, 10:09 AM   '

## 2021-05-31 NOTE — Transfer of Care (Signed)
Immediate Anesthesia Transfer of Care Note  Patient: Erskine Speed  Procedure(s) Performed: Vagal nerve stimulator PLACEMENT  Patient Location: PACU  Anesthesia Type:General  Level of Consciousness: awake, alert  and oriented  Airway & Oxygen Therapy: Patient Spontanous Breathing  Post-op Assessment: Report given to RN and Post -op Vital signs reviewed and stable  Post vital signs: Reviewed and stable  Last Vitals:  Vitals Value Taken Time  BP 128/74 05/31/21 1013  Temp 37.2 C 05/31/21 1013  Pulse 89 05/31/21 1015  Resp 26 05/31/21 1015  SpO2 95 % 05/31/21 1015  Vitals shown include unvalidated device data.  Last Pain:  Vitals:   05/31/21 1013  TempSrc:   PainSc: 0-No pain      Patients Stated Pain Goal: 0 (05/31/21 0557)  Complications: No notable events documented.

## 2021-05-31 NOTE — Anesthesia Postprocedure Evaluation (Signed)
Anesthesia Post Note  Patient: Lisa Boone  Procedure(s) Performed: Vagal nerve stimulator PLACEMENT     Patient location during evaluation: PACU Anesthesia Type: General Level of consciousness: awake Pain management: pain level controlled Vital Signs Assessment: post-procedure vital signs reviewed and stable Respiratory status: spontaneous breathing Cardiovascular status: stable Postop Assessment: no apparent nausea or vomiting Anesthetic complications: no   No notable events documented.  Last Vitals:  Vitals:   05/31/21 1028 05/31/21 1043  BP: 125/79 122/76  Pulse: 90 96  Resp: (!) 23 19  Temp:    SpO2: 94% 95%    Last Pain:  Vitals:   05/31/21 1043  TempSrc:   PainSc: 0-No pain                 Marlin Brys

## 2021-05-31 NOTE — Anesthesia Procedure Notes (Signed)
Procedure Name: Intubation Date/Time: 05/31/2021 8:03 AM Performed by: Griffin Dakin, CRNA Pre-anesthesia Checklist: Patient identified, Emergency Drugs available, Suction available and Patient being monitored Patient Re-evaluated:Patient Re-evaluated prior to induction Oxygen Delivery Method: Circle system utilized Preoxygenation: Pre-oxygenation with 100% oxygen Induction Type: IV induction Ventilation: Mask ventilation without difficulty Laryngoscope Size: Mac and 4 Grade View: Grade I Tube type: Oral Tube size: 7.0 mm Number of attempts: 1 Airway Equipment and Method: Stylet and Oral airway Placement Confirmation: ETT inserted through vocal cords under direct vision, positive ETCO2 and breath sounds checked- equal and bilateral Secured at: 21 cm Tube secured with: Tape Dental Injury: Teeth and Oropharynx as per pre-operative assessment

## 2021-05-31 NOTE — H&P (Signed)
  Chief Complaint  Medically intractable epilepsy  History of Present Illness  Lisa Boone is a 30 y.o. female initially seen in the outpatient neurosurgery clinic as a referral from neurology for medically intractable epilepsy.  Patient has suffered from seizures since the age of 67 and been on multiple different antiepileptic medications with varying levels of success.  Currently maintained on venlafaxine, lamotrigine, and perampanel.  She has previously undergone craniotomy for surgical resection of epileptogenic foci at [facility] but unfortunately has recurrent seizures.  She therefore presents for placement of vagal nerve stimulator.  Past Medical History   Past Medical History:  Diagnosis Date   Depression    Seizures (HCC)     Past Surgical History   Past Surgical History:  Procedure Laterality Date   BRAIN SURGERY     CESAREAN SECTION      Social History   Social History   Tobacco Use   Smoking status: Every Day    Types: Cigarettes   Smokeless tobacco: Never  Vaping Use   Vaping Use: Former  Substance Use Topics   Alcohol use: No   Drug use: No    Medications   Prior to Admission medications   Medication Sig Start Date End Date Taking? Authorizing Provider  FYCOMPA 6 MG TABS Take 6 mg by mouth at bedtime. 04/30/21  Yes [provider]  LamoTRIgine 200 MG TB24 24 hour tablet Take 200 mg by mouth daily. 05/18/21  Yes [provider]  venlafaxine XR (EFFEXOR-XR) 75 MG 24 hr capsule Take 75 mg by mouth at bedtime. 05/12/21  Yes [provider]  albuterol (VENTOLIN HFA) 108 (90 Base) MCG/ACT inhaler Inhale 1 puff into the lungs 4 (four) times daily as needed for wheezing or shortness of breath. Patient not taking: No sig reported 07/09/17   [provider]  amoxicillin-clavulanate (AUGMENTIN) 875-125 MG tablet Take 1 tablet by mouth 2 (two) times daily. Patient not taking: No sig reported 10/30/18   Lonia Blood, MD   levETIRAcetam (KEPPRA) 500 MG tablet Take 1 tablet (500 mg total) by mouth 2 (two) times daily. Patient not taking: No sig reported 10/30/18   Lonia Blood, MD  oseltamivir (TAMIFLU) 75 MG capsule Take 1 capsule (75 mg total) by mouth 2 (two) times daily. Patient not taking: No sig reported 10/30/18   Lonia Blood, MD    Allergies  No Known Allergies  Review of Systems  ROS  Neurologic Exam  Awake, alert, oriented Memory and concentration grossly intact Speech fluent, appropriate CN grossly intact Motor exam: Upper Extremities Deltoid Bicep Tricep Grip  Right 5/5 5/5 5/5 5/5  Left 5/5 5/5 5/5 5/5   Lower Extremities IP Quad PF DF EHL  Right 5/5 5/5 5/5 5/5 5/5  Left 5/5 5/5 5/5 5/5 5/5   Sensation grossly intact to LT  Impression  - 30 y.o. female with medically intractable epilepsy and continued seizures despite attempt at surgical resection of epileptic foci  Plan  -We will plan on proceeding with placement of a left vagal nerve stimulator  I have reviewed the indications for surgery as well as the associated risks, benefits, and alternatives at length with the patient.  We have discussed the expected postoperative course and recovery.  All her questions today were answered.  She provided informed consent to proceed.  Lisbeth Renshaw, MD [facility]

## 2021-05-31 NOTE — Op Note (Signed)
  NEUROSURGERY OPERATIVE NOTE   PREOP DIAGNOSIS: Medically intractable epilepsy   POSTOP DIAGNOSIS: Same  PROCEDURE: 1. Placement of left Vagal nerve stimulator  SURGEON: Dr. Lisbeth Renshaw, MD  ASSISTANT: Dr. Tressie Stalker, MD  ANESTHESIA: General Endotracheal  EBL: Minimalcc  SPECIMENS: None  DRAINS: None  COMPLICATIONS: None immediate  CONDITION: Hemodynamically stable to PACU  HISTORY: Lisa Boone is a 30 y.o. female who was initially seen in the outpatient clinic as a referral from neurology for medically intractable epilepsy. The patient has had continued seizure despite previous left temporal resection. She appeared to be a good candidate for the procedure. The risks and benefits of the surgery were reviewed in detail. After all questions were answered, informed consent was obtained.  PROCEDURE IN DETAIL: After informed consent was obtained and witnessed, the patient was brought to the operating room. After induction of general anesthesia, the patient was positioned on the operative table in the supine position. All pressure points were meticulously padded. Skin incisions were then marked out and prepped and draped in the usual sterile fashion.  After timeout was conducted, left neck transverse skin incision was infiltrated with local anesthetic with epinephrine. Skin incision was then made sharply, and Bovie electrocautery was used to dissect the subcutaneous tissue. The platysma muscle was identified and incised. The platysma was then undermined. The medial border of the sternocleidomastoid muscle was identified, the omohyoid muscle was mobilized, and dissection was carried out utilizing natural tissue planes of the neck until the carotid sheath was identified. The jugular vein was initially identified and the carotid sheath was incised. The vagus nerve was then identified running between the carotid and jugular. The vagus nerve was circumferentially dissected for length  of a proximally 2.5 cm. The axillary incision was then made, and Bovie electrocautery was used to dissect the subcutaneous tissue. A subcutaneous pocket was then made. A subcutaneous tunneler was then passed from the neck to the infraclavicular incision. The vagal stimulator leads were then tunneled. The leads were then placed on the vagus nerve. A relaxing curl was then placed, and the leaves were tacked to the sternocleidomastoid muscle. The generator was then connected to the leads, and testing was done to confirm normal impedance, and good placement and heart rate detection. Wounds were then irrigated with copious amounts of normal saline irrigation. The platysma was closed using interrupted 3-0 Vicryl stitches. Subcutaneous layer and the subclavicular incision was closed using interrupted 3-0 Vicryl stitches. Skin was then closed using interrupted 3-0 Vicryl, and a layer of Dermabond was applied.   At the end of the case all sponge, needle, cottonoid, and instrument counts were correct. The patient was then extubated, transferred to the stretcher, and taken to the postanesthesia care unit in stable hemodynamic condition.  Lisbeth Renshaw, MD [facility]

## 2021-06-01 ENCOUNTER — Encounter (HOSPITAL_COMMUNITY): Payer: Self-pay | Admitting: Neurosurgery

## 2021-06-01 MED FILL — Thrombin For Soln 5000 Unit: CUTANEOUS | Qty: 5000 | Status: AC

## 2023-09-05 DIAGNOSIS — Z6822 Body mass index (BMI) 22.0-22.9, adult: Secondary | ICD-10-CM | POA: Diagnosis not present

## 2023-09-05 DIAGNOSIS — I10 Essential (primary) hypertension: Secondary | ICD-10-CM | POA: Diagnosis not present

## 2023-09-05 DIAGNOSIS — F17210 Nicotine dependence, cigarettes, uncomplicated: Secondary | ICD-10-CM | POA: Diagnosis not present

## 2023-12-12 DIAGNOSIS — F17210 Nicotine dependence, cigarettes, uncomplicated: Secondary | ICD-10-CM | POA: Diagnosis not present

## 2023-12-12 DIAGNOSIS — Z6823 Body mass index (BMI) 23.0-23.9, adult: Secondary | ICD-10-CM | POA: Diagnosis not present

## 2023-12-12 DIAGNOSIS — I10 Essential (primary) hypertension: Secondary | ICD-10-CM | POA: Diagnosis not present

## 2024-02-15 DIAGNOSIS — R61 Generalized hyperhidrosis: Secondary | ICD-10-CM | POA: Diagnosis not present

## 2024-03-11 DIAGNOSIS — I10 Essential (primary) hypertension: Secondary | ICD-10-CM | POA: Diagnosis not present

## 2024-03-11 DIAGNOSIS — Z6822 Body mass index (BMI) 22.0-22.9, adult: Secondary | ICD-10-CM | POA: Diagnosis not present

## 2024-03-11 DIAGNOSIS — F17210 Nicotine dependence, cigarettes, uncomplicated: Secondary | ICD-10-CM | POA: Diagnosis not present

## 2024-03-11 DIAGNOSIS — Z0000 Encounter for general adult medical examination without abnormal findings: Secondary | ICD-10-CM | POA: Diagnosis not present

## 2024-06-05 DIAGNOSIS — E876 Hypokalemia: Secondary | ICD-10-CM | POA: Diagnosis not present

## 2024-06-05 DIAGNOSIS — Z743 Need for continuous supervision: Secondary | ICD-10-CM | POA: Diagnosis not present

## 2024-06-05 DIAGNOSIS — F17210 Nicotine dependence, cigarettes, uncomplicated: Secondary | ICD-10-CM | POA: Diagnosis not present

## 2024-06-05 DIAGNOSIS — Z79899 Other long term (current) drug therapy: Secondary | ICD-10-CM | POA: Diagnosis not present

## 2024-06-05 DIAGNOSIS — I499 Cardiac arrhythmia, unspecified: Secondary | ICD-10-CM | POA: Diagnosis not present

## 2024-06-05 DIAGNOSIS — R410 Disorientation, unspecified: Secondary | ICD-10-CM | POA: Diagnosis not present

## 2024-06-17 DIAGNOSIS — Z6822 Body mass index (BMI) 22.0-22.9, adult: Secondary | ICD-10-CM | POA: Diagnosis not present

## 2024-06-17 DIAGNOSIS — Z0000 Encounter for general adult medical examination without abnormal findings: Secondary | ICD-10-CM | POA: Diagnosis not present

## 2024-06-17 DIAGNOSIS — I10 Essential (primary) hypertension: Secondary | ICD-10-CM | POA: Diagnosis not present

## 2024-06-17 DIAGNOSIS — F17210 Nicotine dependence, cigarettes, uncomplicated: Secondary | ICD-10-CM | POA: Diagnosis not present
